# Patient Record
Sex: Female | Born: 1985 | Race: White | Hispanic: No | Marital: Single | Smoking: Former smoker
Health system: Southern US, Community
[De-identification: ages and names within clinical notes are randomized; demographics above are authoritative.]

## PROBLEM LIST (undated history)

## (undated) ENCOUNTER — Inpatient Hospital Stay (HOSPITAL_COMMUNITY): Payer: Self-pay

## (undated) DIAGNOSIS — F1910 Other psychoactive substance abuse, uncomplicated: Secondary | ICD-10-CM

## (undated) DIAGNOSIS — D649 Anemia, unspecified: Secondary | ICD-10-CM

## (undated) DIAGNOSIS — F17200 Nicotine dependence, unspecified, uncomplicated: Secondary | ICD-10-CM

## (undated) DIAGNOSIS — N83209 Unspecified ovarian cyst, unspecified side: Secondary | ICD-10-CM

## (undated) DIAGNOSIS — F1411 Cocaine abuse, in remission: Secondary | ICD-10-CM

## (undated) DIAGNOSIS — G894 Chronic pain syndrome: Secondary | ICD-10-CM

## (undated) HISTORY — DX: Anemia, unspecified: D64.9

## (undated) HISTORY — PX: EYE SURGERY: SHX253

## (undated) HISTORY — DX: Unspecified ovarian cyst, unspecified side: N83.209

## (undated) HISTORY — PX: OOPHORECTOMY: SHX86

## (undated) HISTORY — PX: TONSILLECTOMY: SUR1361

---
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
## 2008-07-07 ENCOUNTER — Ambulatory Visit (HOSPITAL_COMMUNITY): Admission: RE | Admit: 2008-07-07 | Discharge: 2008-07-07 | Payer: Self-pay | Admitting: Obstetrics and Gynecology

## 2008-08-09 ENCOUNTER — Ambulatory Visit: Admission: RE | Admit: 2008-08-09 | Discharge: 2008-08-09 | Payer: Self-pay | Admitting: Gynecologic Oncology

## 2008-08-16 ENCOUNTER — Ambulatory Visit (HOSPITAL_COMMUNITY): Admission: RE | Admit: 2008-08-16 | Discharge: 2008-08-16 | Payer: Self-pay | Admitting: Gynecologic Oncology

## 2008-08-16 ENCOUNTER — Encounter: Payer: Self-pay | Admitting: Gynecologic Oncology

## 2008-10-26 ENCOUNTER — Other Ambulatory Visit: Admission: RE | Admit: 2008-10-26 | Discharge: 2008-10-26 | Payer: Self-pay | Admitting: Obstetrics and Gynecology

## 2008-11-25 ENCOUNTER — Ambulatory Visit: Payer: Self-pay | Admitting: Advanced Practice Midwife

## 2008-11-25 ENCOUNTER — Inpatient Hospital Stay (HOSPITAL_COMMUNITY): Admission: AD | Admit: 2008-11-25 | Discharge: 2008-11-25 | Payer: Self-pay | Admitting: Obstetrics & Gynecology

## 2009-02-06 ENCOUNTER — Ambulatory Visit: Payer: Self-pay | Admitting: Advanced Practice Midwife

## 2009-02-06 ENCOUNTER — Other Ambulatory Visit: Payer: Self-pay | Admitting: Emergency Medicine

## 2009-02-06 ENCOUNTER — Inpatient Hospital Stay (HOSPITAL_COMMUNITY): Admission: AD | Admit: 2009-02-06 | Discharge: 2009-02-07 | Payer: Self-pay | Admitting: Obstetrics & Gynecology

## 2009-02-11 ENCOUNTER — Ambulatory Visit: Payer: Self-pay | Admitting: Obstetrics and Gynecology

## 2009-02-11 ENCOUNTER — Inpatient Hospital Stay (HOSPITAL_COMMUNITY): Admission: AD | Admit: 2009-02-11 | Discharge: 2009-02-15 | Payer: Self-pay | Admitting: Obstetrics and Gynecology

## 2010-10-28 LAB — CBC
HCT: 28.2 % — ABNORMAL LOW (ref 36.0–46.0)
HCT: 29.9 % — ABNORMAL LOW (ref 36.0–46.0)
Hemoglobin: 9.7 g/dL — ABNORMAL LOW (ref 12.0–15.0)
MCHC: 34.6 g/dL (ref 30.0–36.0)
MCHC: 35.5 g/dL (ref 30.0–36.0)
MCHC: 35.7 g/dL (ref 30.0–36.0)
MCV: 94 fL (ref 78.0–100.0)
MCV: 94.2 fL (ref 78.0–100.0)
Platelets: 157 10*3/uL (ref 150–400)
Platelets: 215 10*3/uL (ref 150–400)
RBC: 2.64 MIL/uL — ABNORMAL LOW (ref 3.87–5.11)
RBC: 2.99 MIL/uL — ABNORMAL LOW (ref 3.87–5.11)
RDW: 13.8 % (ref 11.5–15.5)
WBC: 13.8 10*3/uL — ABNORMAL HIGH (ref 4.0–10.5)
WBC: 9.6 10*3/uL (ref 4.0–10.5)

## 2010-10-28 LAB — DIC (DISSEMINATED INTRAVASCULAR COAGULATION)PANEL
INR: 1 (ref 0.00–1.49)
Platelets: 198 10*3/uL (ref 150–400)
Prothrombin Time: 12.9 seconds (ref 11.6–15.2)
Smear Review: NONE SEEN
aPTT: 30 seconds (ref 24–37)

## 2010-10-28 LAB — POCT I-STAT, CHEM 8
BUN: 5 mg/dL — ABNORMAL LOW (ref 6–23)
Chloride: 106 mEq/L (ref 96–112)
Creatinine, Ser: 0.6 mg/dL (ref 0.4–1.2)
Potassium: 3.7 mEq/L (ref 3.5–5.1)
Sodium: 137 mEq/L (ref 135–145)

## 2010-10-30 LAB — COMPREHENSIVE METABOLIC PANEL
ALT: 14 U/L (ref 0–35)
AST: 15 U/L (ref 0–37)
Albumin: 2.8 g/dL — ABNORMAL LOW (ref 3.5–5.2)
Alkaline Phosphatase: 48 U/L (ref 39–117)
BUN: 4 mg/dL — ABNORMAL LOW (ref 6–23)
Chloride: 109 mEq/L (ref 96–112)
Potassium: 3.7 mEq/L (ref 3.5–5.1)
Sodium: 137 mEq/L (ref 135–145)
Total Bilirubin: 0.5 mg/dL (ref 0.3–1.2)
Total Protein: 5.2 g/dL — ABNORMAL LOW (ref 6.0–8.3)

## 2010-10-30 LAB — CBC
HCT: 25.1 % — ABNORMAL LOW (ref 36.0–46.0)
Platelets: 183 10*3/uL (ref 150–400)
RDW: 13.6 % (ref 11.5–15.5)
WBC: 10.2 10*3/uL (ref 4.0–10.5)

## 2010-10-30 LAB — AMYLASE: Amylase: 39 U/L (ref 27–131)

## 2010-11-05 LAB — DIFFERENTIAL
Basophils Absolute: 0 10*3/uL (ref 0.0–0.1)
Lymphocytes Relative: 24 % (ref 12–46)
Lymphs Abs: 1.7 10*3/uL (ref 0.7–4.0)
Monocytes Absolute: 0.4 10*3/uL (ref 0.1–1.0)
Neutro Abs: 4.9 10*3/uL (ref 1.7–7.7)

## 2010-11-05 LAB — CBC
HCT: 31.1 % — ABNORMAL LOW (ref 36.0–46.0)
MCHC: 33.9 g/dL (ref 30.0–36.0)
MCV: 92.6 fL (ref 78.0–100.0)
Platelets: 174 10*3/uL (ref 150–400)
RDW: 13.2 % (ref 11.5–15.5)

## 2010-11-05 LAB — COMPREHENSIVE METABOLIC PANEL
AST: 17 U/L (ref 0–37)
Albumin: 3.3 g/dL — ABNORMAL LOW (ref 3.5–5.2)
BUN: 4 mg/dL — ABNORMAL LOW (ref 6–23)
Calcium: 8.8 mg/dL (ref 8.4–10.5)
Chloride: 107 mEq/L (ref 96–112)
Creatinine, Ser: 0.45 mg/dL (ref 0.4–1.2)
GFR calc Af Amer: >60 mL/min (ref >60)
Total Bilirubin: 0.4 mg/dL (ref 0.3–1.2)
Total Protein: 5.5 g/dL — ABNORMAL LOW (ref 6.0–8.3)

## 2010-12-04 NOTE — Consult Note (Signed)
[name], [name] NO.:  [id number]   MEDICAL RECORD NO.:  [id number]          PATIENT TYPE:  OUT   LOCATION:  GYN                          FACILITY:  [facility]   PHYSICIAN:  Laurette Schimke, MD     DATE OF BIRTH:  1985-09-29   DATE OF CONSULTATION:  08/09/2008  DATE OF DISCHARGE:                                 CONSULTATION   REASON FOR VISIT:  This patient was sent for consultation by Dr.  Emelda Fear for management of right adnexal mass.   HISTORY OF PRESENT ILLNESS:  This is a 25 year old, gravida 2, para 0  with an intra-uterine pregnancy at 13-4/7 weeks and estimated date of  confinement February 08, 2009.  On imaging, related to her pregnancy an  adnexal mass was noted.  Specifically, on July 07, 2008, a large  right adnexal structure was noted measuring 11.3 x 6.3 x 10.1 cm.  Of  note, there was an intracystic area of mural nodularity within the  cystic lesion measuring approximately 4.6 x 3.0.  It does not contain  identified blood flow on color Doppler.  The area of nodularity contains  several small cystic foci and areas of wall thickening and thickening of  septations.  Patient's history is notable for the fact that she  underwent drainage of a right adnexal cyst approximately 4 years ago.  She denies any weight loss, changes in appetite.  She does report  constipation.  She states that there was spotting last Thursday and she  has noted intermittent cramping this week.   PAST MEDICAL HISTORY:  Denies.   PAST SURGICAL HISTORY:  Laparoscopic drainage of right ovarian cyst in  2006.   PAST UTERINE HISTORY:  Gravida 2, para 0 with 1 medical abortion.   FAMILY HISTORY:  Noted for paternal aunt with breast cancer.  Age at  diagnosis is unknown.   SOCIAL HISTORY:  Tobacco history of 1/2 a pack per day for 5 years.  Stopped smoking 3 months ago at the time of diagnosis of pregnancy.  Alcohol use:  Denies.  Intravenous drug abuse:  Denies.  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   SCREENING HISTORY:  Last Pap March 2009.   REVIEW OF SYSTEMS:  As noted above.   PHYSICAL EXAMINATION:  Well-developed, thin female in no acute distress.  CHEST:  Clear to auscultation.  HEART:  Regular rate and rhythm.  VITAL SIGNS:  Within normal limits.  Weight 152 pounds.  Height 5 feet 8  inches.  ABDOMEN:  Soft, palpable uterus.  There is a mobile, but quite tender,  right adnexal mass.  The mass can be displaced to the upper abdomen.  PELVIC:  Normal external genitalia, Bartholin's, urethra, and Skene's.  No blood was in the vaginal vault.   IMPRESSION:  Tender mobile right lower quadrant mass.  I recommend  laparoscopic right salpingo-oophorectomy.  Patient is aware that the  risks of this procedure are preterm labor and pregnancy loss.  She is  also aware that if it is  not feasible to perform the procedure laparoscopically, likely  an open  procedure will be performed.  She is also aware that since she is  pregnant, surgical staging if there is a malignancy may be limited, just  do an oophorectomy.  The plan is to perform this procedure on August 16, 2008.      Laurette Schimke, MD  Electronically Signed     WB/MEDQ  D:  08/09/2008  T:  08/09/2008  Job:  [id number]   cc:   [name], M.D.  Fax: [phone]   Telford Nab, R.N.  [address]  [place], [place] [address]

## 2010-12-04 NOTE — Op Note (Signed)
[name], [name]              ACCOUNT NO.:  [id number]   MEDICAL RECORD NO.:  [id number]          PATIENT TYPE:  AMB   LOCATION:  DAY                          FACILITY:  [facility]   PHYSICIAN:  Paola A. Duard Brady, MD    DATE OF BIRTH:  22-Sep-1985   DATE OF PROCEDURE:  08/16/2008  DATE OF DISCHARGE:                               OPERATIVE REPORT   PREOPERATIVE DIAGNOSES:  1. A 42 week intrauterine pregnancy.  2. An 11-12 cm complex right ovarian mass.  3. Preoperative fetal heart tones 156.   POSTOPERATIVE DIAGNOSES:  1. A mucinous cystadenoma.  2. Fetal heart tones of 132.   PROCEDURE:  Laparoscopic right salpingo-oophorectomy.   SURGEON:  Dr. Duard Brady and Dr. Emelda Fear.   ANESTHESIA:  General.   ANESTHESIOLOGIST:  Dr. Rica Mast.   ESTIMATED BLOOD LOSS:  Minimal.   IV FLUIDS:  Was 2200 mL.   URINE OUTPUT:  Was 100 mL.   COMPLICATIONS:  None.   SPECIMENS:  Was to pathology.  Specimens included right tube and ovary  and abdominal pelvic washings.   OPERATIVE FINDINGS:  Included preoperative fetal heart tones of 156  document with Dop tones.  She had an 11-12 cm right ovarian mass.  Fluid  was clear yellow.  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  Postoperative fetal heart tones 132.   DESCRIPTION OF PROCEDURE:  The patient was taken to the operating room,  placed in supine position where general anesthesia was induced.  She was  then placed in dorsal lithotomy position with all appropriate  precautions being taken.  Her arms were tucked to her sides with all  appropriate precautions.  The shoulder blocks in the appropriate fashion  were placed.  The abdomen was then prepped in usual fashion.  A Foley  catheter was inserted in the bladder under sterile conditions.  The  patient was then draped in the usual fashion.  An OG tube was placed.  Time-out was performed to confirm the patient procedure, antibiotic  status, side of surgery and surgeons.   After ensuring that the OG tube  was in the appropriate place, a 5 mm incision was made two  fingerbreadths below the costal margin in the midclavicular line.  Using  the Opti Vu a 5 mm trocar was placed into the abdominal wall.  The  abdomen was insufflated with CO2 gas.  At this point in the procedure,  the patient's peak as intra-abdominal pressure did not increase over 15  mmHg.  Under direct visualization, we then placed a right-sided 5 mm  port, approximately 5 cm superior to the most superior aspect of the  mass.  A 10/12 port was then placed on the patient's left side lateral  and approximately 2-3 cm superior to the ACIS.  Abdominal pelvic  washings were then obtained.  We then came across the utero-ovarian with  Kleppingers.  The uterovarian ligament was then transected.  Our initial  attempt had been to come across the meso-ovarian and spared the  fallopian tube.  However, because of the size of the mass and the close  proximity to the fallopian tube, decision was made that the fallopian  tube could not be spared.  We came across the utero-ovarian pedicle and  the fallopian tube with Kleppinger.  There was a small amount of  bleeding that was controlled with a Kleppinger.  We then coagulated the  area and transected with monopolar cautery.  The 15 mm port was then  placed in the area of  the 10/12 port.  The spleen bag was then placed  into the abdominal cavity.  The mass was placed in the spleen bag and  delivered through the inferior left sided port.  The mass was  decompressed within the bag with no spillage on the patient or in the  abdomen.  It was drained of approximately 400 mL of clear yellow fluid.  The mass was then delivered in a piecemeal fashion to the bag.  Again  there was no rupture the bag.  The mass was sent for frozen section and  was consistent with benign disease.  Pedicle was inspected under low  flow and there was noted to be adequate hemostasis.  The  abdominal and  pelvis were irrigated.  Then 200 mL of saline was left in the abdomen.  The ports were removed under direct visualization.  The 10-12/15 mm port  fascia was closed with a figure-of-eight suture of 0 Vicryl on a UR6.  The subcutaneous tissue was reapproximated with 4-0 Vicryl.  Steri-  Strips were placed as were Band-Aids.  Fetal heart tones were then  performed to confirmed fetal heart tones of 132.   The patient tolerated procedure well and was taken to the recovery in  stable condition.  All instrument, needle and Ray-Tec counts were  correct x2.      Paola A. Duard Brady, MD  Electronically Signed     PAG/MEDQ  D:  08/16/2008  T:  08/16/2008  Job:  [id number]   cc:   Telford Nab, R.N.  [address]  [place], [place] [address]   [name], M.D.  Fax: [phone]

## 2010-12-04 NOTE — Op Note (Signed)
Tara, [name] NO.:  [id number]   MEDICAL RECORD NO.:  [id number]          PATIENT TYPE:  INP   LOCATION:  9114                          FACILITY:  [facility]   PHYSICIAN:  [name], M.D. DATE OF BIRTH:  [birth year]   DATE OF PROCEDURE:  02/12/2009  DATE OF DISCHARGE:                               OPERATIVE REPORT   PREOPERATIVE DIAGNOSIS:  Pregnancy 40 weeks' gestation, failure to  progress.   POSTOPERATIVE DIAGNOSIS:  Pregnancy 40 weeks' gestation, failure to  progress.   PROCEDURE:  Primary low-transverse cervical cesarean section.   SURGEON:  [name], MD   ASSISTANT:  None.   ANESTHESIA:  Epidural.   COMPLICATIONS:  None.   FINDINGS:  [name], a 7 pound 5 ounce female infant with significant  molding in the vertex.  Clear amniotic fluid.  No evidence of visible  nuchal cord.   INDICATIONS:  A 25 year old female presenting early on February 11, 2009  with spontaneous rupture of membranes at 8:00 a.m., admitted, and placed  on Pitocin reaching 5 cm dilated by 7:00 p.m. with minimal progress  thereafter.  She developed repetitive variable decelerations with  Pitocin, which could not be corrected with amnioinfusion, intermittent  stoppage in restarting of Pitocin.  Intolerance of labor by the infant  required cesarean delivery.   DETAILS OF PROCEDURE:  The patient was taken to the operating room,  prepped and draped in standard fashion.  Time-out was conducted and  confirmed IV antibiotics Ancef 1 g administered.  Lower abdominal  transverse incision was performed in method of Pfannenstiel with easy  dissection down to the fascia, transverse opening of the fascia, midline  opening of the rectus muscles and peritoneal cavity, and bladder flap  development on the lower uterine segment.  Transverse uterine incision  was performed and fetal vertex rotated from its quite molded position  and to the incision and delivered with fundal pressure  without  difficulty.  Cord was clamped.  The infant placed in the care of the  pediatricians, see their notes for further details.  Cord blood samples  were obtained.  Amniotic fluid was clear without malodor.  Placenta  delivered intact.  [name] presentation, 3-vessel cord confirmed.  The  uterus was swabbed out.  No suspicion of membrane remnants noted.  The  uterus was closed with single layer running locking 0 Vicryl.  Hemostasis was quite good.  A 2-0 Vicryl closure of the bladder flap  followed.  The peritoneal cavity was inspected.  There was a small bit  of omental adhesions to the right adnexa, coming from prior surgery  during the pregnancy.  These were freed-up easily.  The anterior  peritoneum was closed with running 2-0 Vicryl.  The fascia closed with continuous running 0 Vicryl subcu.  Fatty tissue  reapproximated with 3 interrupted sutures of 2-0 Vicryl and subcu 3-0  Vicryl used to close the skin.  Steri-Strips were applied with Benzoin  adhesive and the patient went to recovery room in good condition.  EBL  500 mL.      [name], M.D.  Electronically Signed     JVF/MEDQ  D:  02/12/2009  T:  02/12/2009  Job:  [id number]   cc:   Donna Bernard, M.D.  Fax: [phone]

## 2010-12-04 NOTE — Discharge Summary (Signed)
[name], [name] NO.:  [id number]   MEDICAL RECORD NO.:  [id number]          PATIENT TYPE:  INP   LOCATION:  9114                          FACILITY:  [facility]   PHYSICIAN:  [name], M.D. DATE OF BIRTH:  11-15-1985   DATE OF ADMISSION:  02/11/2009  DATE OF DISCHARGE:  02/15/2009                               DISCHARGE SUMMARY   PRIMARY CARE [name]:  [name], MD at [facility] in  [place].   DISCHARGE DIAGNOSES:  1. Low-transverse cesarean section for nonreassuring fetal heart tones      and failure to progress.  2. Maternal drug screen positive for marijuana.   DISCHARGE MEDICATIONS:  1. Iron sulfate 325 b.i.d.  2. Ibuprofen 600 mg q.8 h. p.r.n. pain.  3. Percocet 5/325 1-2 tabs q.4-6 h. p.r.n. pain.  4. Colace 100 mg b.i.d. p.r.n. constipation.   CONSULTANTS:  Ob-Gyn was heavily involved in the care throughout the  course.   PROCEDURES:  Low-transverse cesarean section on February 12, 2009.  Dr.  Emelda [name] performing cesarean, please see op note for further details.   LABORATORY DATA:  H and H at discharge was 8.8/24.8.  Other labs were  within normal limits with the exception of maternal drug screen positive  for marijuana.   BRIEF HOSPITAL COURSE:  This is a 25 year old G2, P1-0-1-1 who had a low-  transverse cesarean section on February 12, 2009 for nonreassuring fetal  heart tones and failure to progress.  She delivered a girl who is  weighing 7 pounds 5 ounces with Apgars of 9 and 9 at one and five  minutes, essentially no complications during the operation and no  complications with further postop care.  Incision was closed with  absorbable sutures.  The mom, [name] did test positive for marijuana  as noted above.  Social Work was involved and [name] will leave with  her baby and be followed up Social Work in [place].  She is scheduled  to have drug testing periodically.   DISCHARGE INSTRUCTIONS:  Routine for  cesarean section.   FOLLOWUP:  Followup appointments in 6 weeks at [facility].   DISCHARGE CONDITION:  This patient was discharged in stable medical  condition to home.      Clementeen Graham, MD      [name], M.D.  Electronically Signed    EC/MEDQ  D:  02/15/2009  T:  02/15/2009  Job:  [id number]

## 2011-11-09 ENCOUNTER — Emergency Department (HOSPITAL_COMMUNITY)
Admission: EM | Admit: 2011-11-09 | Discharge: 2011-11-09 | Disposition: A | Discharge status: Home / Self Care | Payer: Self-pay | Attending: Emergency Medicine | Admitting: Emergency Medicine

## 2011-11-09 ENCOUNTER — Encounter (HOSPITAL_COMMUNITY): Payer: Self-pay | Admitting: Emergency Medicine

## 2011-11-09 DIAGNOSIS — Z5189 Encounter for other specified aftercare: Secondary | ICD-10-CM

## 2011-11-09 DIAGNOSIS — Z4802 Encounter for removal of sutures: Secondary | ICD-10-CM | POA: Insufficient documentation

## 2011-11-09 MED ORDER — HYDROCODONE-ACETAMINOPHEN 5-325 MG PO TABS
1.0000 | ORAL_TABLET | Freq: Once | ORAL | Status: AC
  Administered 2011-11-09: 1 via ORAL
  Filled 2011-11-09: qty 1

## 2011-11-09 MED ORDER — HYDROCODONE-ACETAMINOPHEN 5-325 MG PO TABS: 1.0000 | ORAL_TABLET | ORAL | Status: AC | PRN

## 2011-11-09 NOTE — Discharge Instructions (Signed)
NOTHING FURTHER CAN BE DONE AT THIS TIME TO REMOVE ANY SUTURE MATERIAL THAT MAY OR MAY NOT BE LEFT IN THE SKIN. RETURN HERE WITH ANY WORSENING PAIN, RE-ACCUMULATION OF THE CYST, REDNESS OR FEVER. TAKE NORCO FOR PAIN AS NEEDED.

## 2011-11-09 NOTE — ED Notes (Addendum)
Per pt, had what she thought was abscess- had drained, but 4 days ago had to have it re-opened on 4/16 and was told that it is not an abscess, but a cyst that needs to be surgically removed- had site sutured up; d/t not having insurance, pt tried to d/c sutures herself, but when trying- tightened sutures; now she needs sutures removed; site is located on inner thigh

## 2011-11-09 NOTE — ED Provider Notes (Signed)
Medical screening examination/treatment/procedure(s) were performed by non-physician practitioner and as supervising physician I was immediately available for consultation/collaboration.  Nilo Fallin, MD 11/09/11 0341 

## 2011-11-09 NOTE — ED Provider Notes (Signed)
History     CSN: [id number]  Arrival date & time 11/09/11  0010   First MD Initiated Contact with Patient 11/09/11 0112      Chief Complaint  Patient presents with  . Suture / Staple Removal    (Consider location/radiation/quality/duration/timing/severity/associated sxs/prior treatment) Patient is a 26 y.o. female presenting with suture removal. The history is provided by the patient.  Suture / Staple Removal  The sutures were placed 3 to 6 days ago. Treatments tried: She had an I&D of a labial cyst in [place] 4 days ago that left 2 stitches. She attempted to remove them herself and reports that she feels that she did not remove the whole suture. There has been no drainage from the wound. There is no redness present. The pain has improved.    History reviewed. No pertinent past medical history.  Past Surgical History  Procedure Date  . Tonsillectomy   . Cesarean section   . Oophorectomy     History reviewed. No pertinent family history.  History  Substance Use Topics  . Smoking status: Current Everyday Smoker -- 0.5 packs/day  . Smokeless tobacco: Not on file  . Alcohol Use: No    OB History    Grav Para Term Preterm Abortions TAB SAB Ect Mult Living                  Review of Systems  Constitutional: Negative.  Negative for fever.  Skin:       See HPI.    Allergies  Review of patient's allergies indicates no known allergies.  Home Medications   Current Outpatient Rx  Name Route Sig Dispense Refill  . CEPHALEXIN 500 MG PO CAPS Oral Take 500 mg by mouth 3 (three) times daily.    Marland Kitchen NAPHAZOLINE HCL 0.012 % OP SOLN Both Eyes Place 1 drop into both eyes 4 (four) times daily as needed. For allergies    . OXYCODONE-ACETAMINOPHEN 5-325 MG PO TABS Oral Take 1 tablet by mouth every 4 (four) hours as needed. For pain    . SULFAMETHOXAZOLE-TRIMETHOPRIM 800-160 MG PO TABS Oral Take 1 tablet by mouth 2 (two) times daily.      BP 105/66  Pulse 68  Temp(Src) 97.8  F (36.6 C) (Oral)  Resp 18  SpO2 96%  LMP 10/28/2011  Physical Exam  Genitourinary:       1 cm linear incision right labia that is healing. No wound dehiscence. No visible sutures at site. No swelling.     ED Course  Procedures (including critical care time)  Labs Reviewed - No data to display No results found.   No diagnosis found.  1. Wound recheck -   MDM  There are no visible sutures at wound site. No redness. No further treatment. She is encouraged to return with any increasing pain, redness or drainage.         Rodena Medin, PA-C 11/09/11 0200

## 2012-01-07 ENCOUNTER — Emergency Department (HOSPITAL_COMMUNITY): Payer: Self-pay

## 2012-01-07 ENCOUNTER — Encounter (HOSPITAL_COMMUNITY): Payer: Self-pay | Admitting: Emergency Medicine

## 2012-01-07 ENCOUNTER — Emergency Department (HOSPITAL_COMMUNITY)
Admission: EM | Admit: 2012-01-07 | Discharge: 2012-01-08 | Disposition: A | Discharge status: Home / Self Care | Payer: Self-pay | Attending: Emergency Medicine | Admitting: Emergency Medicine

## 2012-01-07 DIAGNOSIS — S0280XA Fracture of other specified skull and facial bones, unspecified side, initial encounter for closed fracture: Secondary | ICD-10-CM | POA: Insufficient documentation

## 2012-01-07 DIAGNOSIS — S02839A Fracture of medial orbital wall, unspecified side, initial encounter for closed fracture: Secondary | ICD-10-CM

## 2012-01-07 DIAGNOSIS — S139XXA Sprain of joints and ligaments of unspecified parts of neck, initial encounter: Secondary | ICD-10-CM | POA: Insufficient documentation

## 2012-01-07 DIAGNOSIS — F17200 Nicotine dependence, unspecified, uncomplicated: Secondary | ICD-10-CM | POA: Insufficient documentation

## 2012-01-07 DIAGNOSIS — IMO0002 Reserved for concepts with insufficient information to code with codable children: Secondary | ICD-10-CM | POA: Insufficient documentation

## 2012-01-07 DIAGNOSIS — S161XXA Strain of muscle, fascia and tendon at neck level, initial encounter: Secondary | ICD-10-CM

## 2012-01-07 DIAGNOSIS — H5710 Ocular pain, unspecified eye: Secondary | ICD-10-CM | POA: Insufficient documentation

## 2012-01-07 DIAGNOSIS — S20229A Contusion of unspecified back wall of thorax, initial encounter: Secondary | ICD-10-CM | POA: Insufficient documentation

## 2012-01-07 DIAGNOSIS — Y92009 Unspecified place in unspecified non-institutional (private) residence as the place of occurrence of the external cause: Secondary | ICD-10-CM | POA: Insufficient documentation

## 2012-01-07 MED ORDER — TRAMADOL HCL 50 MG PO TABS
50.0000 mg | ORAL_TABLET | Freq: Once | ORAL | Status: AC
  Administered 2012-01-07: 50 mg via ORAL
  Filled 2012-01-07: qty 1

## 2012-01-07 NOTE — ED Notes (Signed)
Patient states she has to wear contacts and has a contact in the right eye but no contact in the left eye. When performing the visual acuity patient stated "I need to get an eye exam and I just woke up and I'm not supposed to sleep with the contacts in and now my vision is blurry."
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
## 2012-01-07 NOTE — ED Notes (Signed)
Left eye swollen shut, requests fluid be drawn from eye

## 2012-01-08 MED ORDER — CEPHALEXIN 500 MG PO CAPS: 500.0000 mg | ORAL_CAPSULE | Freq: Four times a day (QID) | ORAL | Status: AC

## 2012-01-08 MED ORDER — OXYCODONE-ACETAMINOPHEN 5-325 MG PO TABS: 1.0000 | ORAL_TABLET | ORAL | Status: AC | PRN

## 2012-01-08 MED ORDER — CEPHALEXIN 500 MG PO CAPS
500.0000 mg | ORAL_CAPSULE | Freq: Once | ORAL | Status: AC
  Administered 2012-01-08: 500 mg via ORAL
  Filled 2012-01-08: qty 1

## 2012-01-14 NOTE — ED Provider Notes (Signed)
Medical screening examination/treatment/procedure(s) were performed by non-physician practitioner and as supervising physician I was immediately available for consultation/collaboration.  Shelda Jakes, MD 01/14/12 [phone]

## 2012-08-25 ENCOUNTER — Emergency Department (HOSPITAL_BASED_OUTPATIENT_CLINIC_OR_DEPARTMENT_OTHER): Payer: Medicaid Other

## 2012-08-25 ENCOUNTER — Emergency Department (HOSPITAL_BASED_OUTPATIENT_CLINIC_OR_DEPARTMENT_OTHER)
Admission: EM | Admit: 2012-08-25 | Discharge: 2012-08-25 | Disposition: A | Discharge status: Home / Self Care | Payer: Medicaid Other | Attending: Emergency Medicine | Admitting: Emergency Medicine

## 2012-08-25 ENCOUNTER — Encounter (HOSPITAL_BASED_OUTPATIENT_CLINIC_OR_DEPARTMENT_OTHER): Payer: Self-pay | Admitting: *Deleted

## 2012-08-25 DIAGNOSIS — S0003XA Contusion of scalp, initial encounter: Secondary | ICD-10-CM | POA: Insufficient documentation

## 2012-08-25 DIAGNOSIS — F10929 Alcohol use, unspecified with intoxication, unspecified: Secondary | ICD-10-CM

## 2012-08-25 DIAGNOSIS — Z792 Long term (current) use of antibiotics: Secondary | ICD-10-CM | POA: Insufficient documentation

## 2012-08-25 DIAGNOSIS — F17200 Nicotine dependence, unspecified, uncomplicated: Secondary | ICD-10-CM | POA: Insufficient documentation

## 2012-08-25 DIAGNOSIS — F1010 Alcohol abuse, uncomplicated: Secondary | ICD-10-CM | POA: Insufficient documentation

## 2012-08-25 DIAGNOSIS — T07XXXA Unspecified multiple injuries, initial encounter: Secondary | ICD-10-CM

## 2012-08-25 DIAGNOSIS — Z79899 Other long term (current) drug therapy: Secondary | ICD-10-CM | POA: Insufficient documentation

## 2012-08-25 DIAGNOSIS — Z3202 Encounter for pregnancy test, result negative: Secondary | ICD-10-CM | POA: Insufficient documentation

## 2012-08-25 DIAGNOSIS — S0993XA Unspecified injury of face, initial encounter: Secondary | ICD-10-CM | POA: Insufficient documentation
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
## 2012-08-28 ENCOUNTER — Encounter (HOSPITAL_COMMUNITY): Payer: Self-pay | Admitting: *Deleted

## 2012-08-28 ENCOUNTER — Emergency Department (HOSPITAL_COMMUNITY): Payer: Medicaid Other

## 2012-08-28 ENCOUNTER — Emergency Department (HOSPITAL_COMMUNITY)
Admission: EM | Admit: 2012-08-28 | Discharge: 2012-08-29 | Disposition: A | Discharge status: Home / Self Care | Payer: Medicaid Other | Attending: Emergency Medicine | Admitting: Emergency Medicine

## 2012-08-28 DIAGNOSIS — F17200 Nicotine dependence, unspecified, uncomplicated: Secondary | ICD-10-CM | POA: Insufficient documentation

## 2012-08-28 DIAGNOSIS — R071 Chest pain on breathing: Secondary | ICD-10-CM | POA: Insufficient documentation

## 2012-08-28 DIAGNOSIS — Z79899 Other long term (current) drug therapy: Secondary | ICD-10-CM | POA: Insufficient documentation

## 2012-08-28 DIAGNOSIS — R6884 Jaw pain: Secondary | ICD-10-CM | POA: Insufficient documentation

## 2012-08-28 DIAGNOSIS — G8911 Acute pain due to trauma: Secondary | ICD-10-CM | POA: Insufficient documentation

## 2012-08-28 DIAGNOSIS — R0789 Other chest pain: Secondary | ICD-10-CM
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
## 2012-09-16 ENCOUNTER — Inpatient Hospital Stay (HOSPITAL_COMMUNITY): Payer: Medicaid Other

## 2012-09-16 ENCOUNTER — Inpatient Hospital Stay (HOSPITAL_COMMUNITY)
Admission: EM | Admit: 2012-09-16 | Discharge: 2012-09-23 | DRG: 781 | Disposition: A | Discharge status: Home Health | Payer: Medicaid Other | Attending: General Surgery | Admitting: General Surgery

## 2012-09-16 ENCOUNTER — Emergency Department (HOSPITAL_COMMUNITY): Payer: Medicaid Other

## 2012-09-16 ENCOUNTER — Encounter (HOSPITAL_COMMUNITY): Payer: Self-pay | Admitting: Emergency Medicine

## 2012-09-16 DIAGNOSIS — S01501A Unspecified open wound of lip, initial encounter: Secondary | ICD-10-CM | POA: Diagnosis present

## 2012-09-16 DIAGNOSIS — I609 Nontraumatic subarachnoid hemorrhage, unspecified: Secondary | ICD-10-CM | POA: Diagnosis present

## 2012-09-16 DIAGNOSIS — S0292XA Unspecified fracture of facial bones, initial encounter for closed fracture: Secondary | ICD-10-CM

## 2012-09-16 DIAGNOSIS — O99891 Other specified diseases and conditions complicating pregnancy: Principal | ICD-10-CM | POA: Diagnosis present

## 2012-09-16 DIAGNOSIS — S52501A Unspecified fracture of the lower end of right radius, initial encounter for closed fracture: Secondary | ICD-10-CM

## 2012-09-16 DIAGNOSIS — S82899A Other fracture of unspecified lower leg, initial encounter for closed fracture: Secondary | ICD-10-CM | POA: Diagnosis present

## 2012-09-16 DIAGNOSIS — S92109A Unspecified fracture of unspecified talus, initial encounter for closed fracture: Secondary | ICD-10-CM | POA: Diagnosis present

## 2012-09-16 DIAGNOSIS — S0240CA Maxillary fracture, right side, initial encounter for closed fracture: Secondary | ICD-10-CM

## 2012-09-16 DIAGNOSIS — K5900 Constipation, unspecified: Secondary | ICD-10-CM | POA: Diagnosis not present

## 2012-09-16 DIAGNOSIS — S52509A Unspecified fracture of the lower end of unspecified radius, initial encounter for closed fracture: Secondary | ICD-10-CM | POA: Diagnosis present

## 2012-09-16 DIAGNOSIS — S0230XA Fracture of orbital floor, unspecified side, initial encounter for closed fracture: Secondary | ICD-10-CM | POA: Diagnosis present

## 2012-09-16 DIAGNOSIS — S82891A Other fracture of right lower leg, initial encounter for closed fracture: Secondary | ICD-10-CM

## 2012-09-16 DIAGNOSIS — S066X9A Traumatic subarachnoid hemorrhage with loss of consciousness of unspecified duration, initial encounter: Secondary | ICD-10-CM

## 2012-09-16 DIAGNOSIS — F1410 Cocaine abuse, uncomplicated: Secondary | ICD-10-CM | POA: Diagnosis present

## 2012-09-16 DIAGNOSIS — F17200 Nicotine dependence, unspecified, uncomplicated: Secondary | ICD-10-CM | POA: Diagnosis present

## 2012-09-16 DIAGNOSIS — J939 Pneumothorax, unspecified: Secondary | ICD-10-CM

## 2012-09-16 DIAGNOSIS — S270XXA Traumatic pneumothorax, initial encounter: Secondary | ICD-10-CM | POA: Diagnosis present

## 2012-09-16 DIAGNOSIS — E876 Hypokalemia: Secondary | ICD-10-CM | POA: Diagnosis present

## 2012-09-16 DIAGNOSIS — Z3480 Encounter for supervision of other normal pregnancy, unspecified trimester: Secondary | ICD-10-CM

## 2012-09-16 DIAGNOSIS — S5291XA Unspecified fracture of right forearm, initial encounter for closed fracture: Secondary | ICD-10-CM

## 2012-09-16 DIAGNOSIS — S066XAA Traumatic subarachnoid hemorrhage with loss of consciousness status unknown, initial encounter: Secondary | ICD-10-CM | POA: Diagnosis present

## 2012-09-16 DIAGNOSIS — S0280XA Fracture of other specified skull and facial bones, unspecified side, initial encounter for closed fracture: Secondary | ICD-10-CM | POA: Diagnosis present

## 2012-09-16 DIAGNOSIS — S82201A Unspecified fracture of shaft of right tibia, initial encounter for closed fracture: Secondary | ICD-10-CM

## 2012-09-16 DIAGNOSIS — D62 Acute posthemorrhagic anemia: Secondary | ICD-10-CM | POA: Diagnosis not present

## 2012-09-16 DIAGNOSIS — S0285XA Fracture of orbit, unspecified, initial encounter for closed fracture: Secondary | ICD-10-CM

## 2012-09-16 DIAGNOSIS — S5290XA Unspecified fracture of unspecified forearm, initial encounter for closed fracture: Secondary | ICD-10-CM

## 2012-09-16 DIAGNOSIS — S27322A Contusion of lung, bilateral, initial encounter: Secondary | ICD-10-CM

## 2012-09-16 DIAGNOSIS — S02401A Maxillary fracture, unspecified, initial encounter for closed fracture: Secondary | ICD-10-CM | POA: Diagnosis present

## 2012-09-16 DIAGNOSIS — S27329A Contusion of lung, unspecified, initial encounter: Secondary | ICD-10-CM | POA: Diagnosis present

## 2012-09-16 DIAGNOSIS — S82401A Unspecified fracture of shaft of right fibula, initial encounter for closed fracture: Secondary | ICD-10-CM

## 2012-09-16 HISTORY — DX: Nicotine dependence, unspecified, uncomplicated: F17.200

## 2012-09-16 LAB — URINALYSIS, ROUTINE W REFLEX MICROSCOPIC
Bilirubin Urine: NEGATIVE
Glucose, UA: NEGATIVE mg/dL
Specific Gravity, Urine: 1.011 (ref 1.005–1.030)
Urobilinogen, UA: 0.2 mg/dL (ref 0.0–1.0)
pH: 6.5 (ref 5.0–8.0)

## 2012-09-16 LAB — URINE MICROSCOPIC-ADD ON

## 2012-09-16 LAB — RAPID URINE DRUG SCREEN, HOSP PERFORMED
Barbiturates: NOT DETECTED
Benzodiazepines: NOT DETECTED
Cocaine: POSITIVE — AB
Opiates: POSITIVE — AB

## 2012-09-16 LAB — POCT I-STAT, CHEM 8
BUN: 5 mg/dL — ABNORMAL LOW (ref 6–23)
Chloride: 103 mEq/L (ref 96–112)
Creatinine, Ser: 0.6 mg/dL (ref 0.50–1.10)
Glucose, Bld: 107 mg/dL — ABNORMAL HIGH (ref 70–99)
Potassium: 3.1 mEq/L — ABNORMAL LOW (ref 3.5–5.1)

## 2012-09-16 LAB — CBC
HCT: 39.7 % (ref 36.0–46.0)
Hemoglobin: 14.2 g/dL (ref 12.0–15.0)
MCH: 32 pg (ref 26.0–34.0)
MCHC: 35.8 g/dL (ref 30.0–36.0)
MCV: 89.4 fL (ref 78.0–100.0)
RBC: 4.44 MIL/uL (ref 3.87–5.11)

## 2012-09-16 MED ORDER — MIDAZOLAM HCL 2 MG/2ML IJ SOLN: 5.0000 mg | Freq: Once | INTRAMUSCULAR | Status: DC

## 2012-09-16 MED ORDER — FENTANYL CITRATE 0.05 MG/ML IJ SOLN
INTRAMUSCULAR | Status: AC
  Administered 2012-09-16: 100 ug
  Filled 2012-09-16: qty 2

## 2012-09-16 MED ORDER — DEXTROSE IN LACTATED RINGERS 5 % IV SOLN
INTRAVENOUS | Status: DC
  Administered 2012-09-16 – 2012-09-18 (×3): via INTRAVENOUS

## 2012-09-16 MED ORDER — IOHEXOL 300 MG/ML  SOLN
80.0000 mL | Freq: Once | INTRAMUSCULAR | Status: AC | PRN
  Administered 2012-09-16: 80 mL via INTRAVENOUS

## 2012-09-16 MED ORDER — MIDAZOLAM HCL 2 MG/2ML IJ SOLN
INTRAMUSCULAR | Status: AC
  Administered 2012-09-16: 5 mg
  Filled 2012-09-16: qty 6

## 2012-09-16 MED ORDER — DIPHENHYDRAMINE HCL 50 MG/ML IJ SOLN: 12.5000 mg | Freq: Four times a day (QID) | INTRAMUSCULAR | Status: DC | PRN

## 2012-09-16 MED ORDER — NALOXONE HCL 0.4 MG/ML IJ SOLN: 0.4000 mg | INTRAMUSCULAR | Status: DC | PRN

## 2012-09-16 MED ORDER — ONDANSETRON HCL 4 MG/2ML IJ SOLN: 4.0000 mg | Freq: Four times a day (QID) | INTRAMUSCULAR | Status: DC | PRN

## 2012-09-16 MED ORDER — MORPHINE SULFATE 4 MG/ML IJ SOLN
4.0000 mg | Freq: Once | INTRAMUSCULAR | Status: AC
  Administered 2012-09-16: 4 mg via INTRAVENOUS
  Filled 2012-09-16: qty 1

## 2012-09-16 MED ORDER — DIPHENHYDRAMINE HCL 12.5 MG/5ML PO ELIX
12.5000 mg | ORAL_SOLUTION | Freq: Four times a day (QID) | ORAL | Status: DC | PRN
  Filled 2012-09-16: qty 5

## 2012-09-16 MED ORDER — PANTOPRAZOLE SODIUM 40 MG PO TBEC: 40.0000 mg | DELAYED_RELEASE_TABLET | Freq: Every day | ORAL | Status: DC

## 2012-09-16 MED ORDER — HYDROMORPHONE 0.3 MG/ML IV SOLN
INTRAVENOUS | Status: AC
  Administered 2012-09-16: 0.3 mg via INTRAVENOUS
  Filled 2012-09-16: qty 25

## 2012-09-16 MED ORDER — PANTOPRAZOLE SODIUM 40 MG IV SOLR
40.0000 mg | Freq: Every day | INTRAVENOUS | Status: DC
  Filled 2012-09-16: qty 40

## 2012-09-16 MED ORDER — FENTANYL CITRATE 0.05 MG/ML IJ SOLN: 100.0000 ug | Freq: Once | INTRAMUSCULAR | Status: DC

## 2012-09-16 MED ORDER — SODIUM CHLORIDE 0.9 % IJ SOLN: 9.0000 mL | INTRAMUSCULAR | Status: DC | PRN

## 2012-09-16 MED ORDER — HYDROMORPHONE HCL PF 1 MG/ML IJ SOLN
1.0000 mg | INTRAMUSCULAR | Status: DC | PRN
  Administered 2012-09-16 (×3): 1 mg via INTRAVENOUS
  Filled 2012-09-16 (×3): qty 1

## 2012-09-16 MED ORDER — ONDANSETRON HCL 4 MG PO TABS: 4.0000 mg | ORAL_TABLET | Freq: Four times a day (QID) | ORAL | Status: DC | PRN

## 2012-09-16 MED ORDER — CLINDAMYCIN PHOSPHATE 600 MG/50ML IV SOLN
600.0000 mg | Freq: Once | INTRAVENOUS | Status: AC
  Administered 2012-09-16: 600 mg via INTRAVENOUS
  Filled 2012-09-16: qty 50

## 2012-09-16 MED ORDER — HYDROMORPHONE 0.3 MG/ML IV SOLN
INTRAVENOUS | Status: DC
  Administered 2012-09-17: 4.8 mg via INTRAVENOUS
  Administered 2012-09-17: 3 mg via INTRAVENOUS
  Administered 2012-09-17: 02:00:00 via INTRAVENOUS
  Filled 2012-09-16: qty 25

## 2012-09-16 MED ORDER — HYDROMORPHONE HCL PF 1 MG/ML IJ SOLN
INTRAMUSCULAR | Status: AC
  Administered 2012-09-16: 1 mg via INTRAVENOUS
  Filled 2012-09-16: qty 1

## 2012-09-16 NOTE — ED Notes (Signed)
PT mom Parke Poisson [phone] and Sharia Reeve [phone]
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
## 2012-09-16 NOTE — Progress Notes (Signed)
Pt. Transferred to ultrasound.  Order obtained to be without RN for trip via Dr. Magnus Ivan.  Pt. unable to urinate while in ultrasound.  Radiologist gave verbal order to obtain I&O cath.  I&O cath performed revealing urine in bladder.  Pt. stable at this time.

## 2012-09-16 NOTE — ED Notes (Signed)
Transported to ct scan, given pain meds, they are changing bed assignment to 3100
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
## 2012-09-16 NOTE — Consult Note (Signed)
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 Nieves, M.D.     ROS:   as above  Blood pressure 94/57, pulse 80, temperature 98.5 F (36.9 C), temperature source Oral, resp. rate 20, height 5\' 7"  (1.702 m), weight 74.8 kg (164 lb 14.5 oz), last menstrual period 08/19/2012, SpO2 99.00%.  PHYSICAL EXAM: Overall appearance:  Distressed.  Appropriate to questions.  Follows commands well.  Essentially no facial swelling or ecchymoses.  RIGHT forearm in splint.  RIGHT lower leg/ankle in splint.  Hard cervical collar in place.   Head:  No lacerations.  No Battle's sign.  Bony facial contours show step off RIGHT infra orb rim.  Nose intact.  Mandible intact.   Eyes:  Subconjunctival hemorrhage and chemosis, OD.  Subjectively intact vision OU.  Grossly conjugate gaze OU with no subjective diplopia.  Some non-specific pain with ROM OD.  PERRL. Ears: sl waxy.  No blood Nose:  Septum straight.  No blood, fx, lacerations. Oral Cavity:  Teeth in good repair. Oral Pharynx/Hypopharynx/Larynx:  Could not examine Neck:  Hard collar in place.  Studies Reviewed:  CT maxillofacial as above.    Assessment/Plan Complex, comminuted, and severely displaced RIGHT zygoma fx, incl med, lat, and inf walls of orbit, ant, lat, post walls of antrum.  Will need ice, elevation, no nose blowing.  Ophth  consult.  Anesthesia issues related to [facility], pneumotx, pregnancy.  This will require a surgical repair, including lateral brow, lower lid, and possibly sub labial approaches.  Orbital rim plating, orbital floor reconstruction plate.  Given minimal swelling, could do this any time after clearance from Ophth and Anesthesia.  Discussed with pt.  Will be glad to discuss with family as needed.  Flo Shanks 09/16/2012, 10:13 AM

## 2012-09-16 NOTE — Procedures (Signed)
Chest Tube Insertion Procedure Note  Indications:  Clinically significant left Pneumothorax  Pre-operative Diagnosis: Left Pneumothorax  Post-operative Diagnosis: Left Pneumothorax  Surgeon:  Violeta Gelinas, MD  Assist: Aris Georgia, PA-C  Procedure Details  Informed consent was obtained for the procedure, including sedation.  Risks of lung perforation, hemorrhage, arrhythmia, and adverse drug reaction were discussed.   After sterile skin prep, using standard technique, a 20 French tube was placed in the left Anterior axillary line at the nipple level. Large rush of air obtained. Tube was secured with sutures and occlusive dressing was applied. She tolerated this very well. Blood output less than 50 cc.  Estimated Blood Loss:  <50cc         Specimens:  None              Complications:  None; patient tolerated the procedure well.         Disposition: ICU - extubated and stable.         Condition: stable  Violeta Gelinas, MD, MPH, FACS Pager: [phone]
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
## 2012-09-16 NOTE — Progress Notes (Signed)
Orthopedic Tech Progress Note Patient Details:  [name] [birth year] [id number]  Ortho Devices Type of Ortho Device: Arm sling;Post (short arm) splint;Post (short leg) splint   Haskell Flirt 09/16/2012, 6:19 AM

## 2012-09-16 NOTE — Progress Notes (Signed)
Earmon Sherrow, MD, MPH, FACS Pager: [phone]  
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
## 2012-09-16 NOTE — Progress Notes (Signed)
Patient's family was called around 1600 per request of the patient.  The mother and father came to unit with a misunderstanding that the patient had been discharged from the E.D.  Patient was in ultrasound at this time. Her parents were lead down to ultrasound to talk with patient.  Patient does not want them to know that she is pregnant at this time.
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
## 2012-09-17 ENCOUNTER — Inpatient Hospital Stay (HOSPITAL_COMMUNITY): Payer: Medicaid Other

## 2012-09-17 ENCOUNTER — Inpatient Hospital Stay (HOSPITAL_COMMUNITY): Payer: Medicaid Other | Admitting: Anesthesiology

## 2012-09-17 ENCOUNTER — Encounter (HOSPITAL_COMMUNITY): Admission: EM | Disposition: A | Discharge status: Home Health | Payer: Self-pay | Source: Home / Self Care

## 2012-09-17 ENCOUNTER — Encounter (HOSPITAL_COMMUNITY): Payer: Self-pay | Admitting: Anesthesiology

## 2012-09-17 DIAGNOSIS — D62 Acute posthemorrhagic anemia: Secondary | ICD-10-CM | POA: Diagnosis not present

## 2012-09-17 DIAGNOSIS — F1910 Other psychoactive substance abuse, uncomplicated: Secondary | ICD-10-CM | POA: Insufficient documentation

## 2012-09-17 DIAGNOSIS — Z3480 Encounter for supervision of other normal pregnancy, unspecified trimester: Secondary | ICD-10-CM

## 2012-09-17 HISTORY — PX: ORIF ANKLE FRACTURE: SHX5408

## 2012-09-17 HISTORY — PX: OPEN REDUCTION INTERNAL FIXATION (ORIF) DISTAL RADIAL FRACTURE: SHX5989

## 2012-09-17 LAB — BASIC METABOLIC PANEL
BUN: <3 mg/dL — ABNORMAL LOW (ref 6–23)
CO2: 23 mEq/L (ref 19–32)
Chloride: 105 mEq/L (ref 96–112)
GFR calc Af Amer: >90 mL/min (ref >90)
Glucose, Bld: 109 mg/dL — ABNORMAL HIGH (ref 70–99)
Potassium: 3.6 mEq/L (ref 3.5–5.1)

## 2012-09-17 LAB — CBC
HCT: 34.1 % — ABNORMAL LOW (ref 36.0–46.0)
Hemoglobin: 11.8 g/dL — ABNORMAL LOW (ref 12.0–15.0)
RBC: 3.74 MIL/uL — ABNORMAL LOW (ref 3.87–5.11)
WBC: 7.5 10*3/uL (ref 4.0–10.5)

## 2012-09-17 SURGERY — OPEN REDUCTION INTERNAL FIXATION (ORIF) DISTAL RADIUS FRACTURE
Anesthesia: General | Site: Wrist | Laterality: Right | Wound class: Clean

## 2012-09-17 MED ORDER — HYDROMORPHONE 0.3 MG/ML IV SOLN
INTRAVENOUS | Status: DC
  Administered 2012-09-17 (×2): via INTRAVENOUS
  Administered 2012-09-17: 5.4 mg via INTRAVENOUS
  Administered 2012-09-18: 02:00:00 via INTRAVENOUS
  Administered 2012-09-18: 1.3 mg via INTRAVENOUS
  Filled 2012-09-17 (×3): qty 25

## 2012-09-17 MED ORDER — PRENATAL MULTIVITAMIN CH
1.0000 | ORAL_TABLET | Freq: Every day | ORAL | Status: DC
  Administered 2012-09-18 – 2012-09-23 (×5): 1 via ORAL
  Filled 2012-09-17 (×7): qty 1

## 2012-09-17 MED ORDER — ARTIFICIAL TEARS OP OINT
TOPICAL_OINTMENT | OPHTHALMIC | Status: DC | PRN
  Administered 2012-09-17: 1 via OPHTHALMIC

## 2012-09-17 MED ORDER — LACTATED RINGERS IV SOLN
INTRAVENOUS | Status: DC | PRN
  Administered 2012-09-17 (×3): via INTRAVENOUS

## 2012-09-17 MED ORDER — GLYCOPYRROLATE 0.2 MG/ML IJ SOLN
INTRAMUSCULAR | Status: DC | PRN
  Administered 2012-09-17: 0.4 mg via INTRAVENOUS

## 2012-09-17 MED ORDER — FENTANYL CITRATE 0.05 MG/ML IJ SOLN
INTRAMUSCULAR | Status: AC
  Filled 2012-09-17: qty 2

## 2012-09-17 MED ORDER — MEPERIDINE HCL 25 MG/ML IJ SOLN: 6.2500 mg | INTRAMUSCULAR | Status: DC | PRN

## 2012-09-17 MED ORDER — OXYCODONE HCL 5 MG PO TABS: 5.0000 mg | ORAL_TABLET | Freq: Once | ORAL | Status: DC | PRN

## 2012-09-17 MED ORDER — FENTANYL CITRATE 0.05 MG/ML IJ SOLN
INTRAMUSCULAR | Status: DC | PRN
  Administered 2012-09-17: 100 ug via INTRAVENOUS
  Administered 2012-09-17 (×2): 50 ug via INTRAVENOUS
  Administered 2012-09-17: 100 ug via INTRAVENOUS
  Administered 2012-09-17 (×4): 50 ug via INTRAVENOUS
  Administered 2012-09-17: 250 ug via INTRAVENOUS
  Administered 2012-09-17: 100 ug via INTRAVENOUS
  Administered 2012-09-17 (×2): 50 ug via INTRAVENOUS

## 2012-09-17 MED ORDER — HYDROMORPHONE HCL PF 1 MG/ML IJ SOLN
INTRAMUSCULAR | Status: AC
  Filled 2012-09-17: qty 1

## 2012-09-17 MED ORDER — FENTANYL CITRATE 0.05 MG/ML IJ SOLN
100.0000 ug | Freq: Once | INTRAMUSCULAR | Status: AC
  Administered 2012-09-17: 100 ug via INTRAVENOUS

## 2012-09-17 MED ORDER — CEFAZOLIN SODIUM 1-5 GM-% IV SOLN
1.0000 g | Freq: Once | INTRAVENOUS | Status: AC
  Administered 2012-09-17: 1 g via INTRAVENOUS
  Filled 2012-09-17 (×2): qty 50

## 2012-09-17 MED ORDER — OXYCODONE HCL 5 MG/5ML PO SOLN: 5.0000 mg | Freq: Once | ORAL | Status: DC | PRN

## 2012-09-17 MED ORDER — VECURONIUM BROMIDE 10 MG IV SOLR
INTRAVENOUS | Status: DC | PRN
  Administered 2012-09-17: 4 mg via INTRAVENOUS
  Administered 2012-09-17 (×2): 3 mg via INTRAVENOUS

## 2012-09-17 MED ORDER — LIDOCAINE HCL (CARDIAC) 20 MG/ML IV SOLN
INTRAVENOUS | Status: DC | PRN
  Administered 2012-09-17: 50 mg via INTRAVENOUS

## 2012-09-17 MED ORDER — HYDROMORPHONE HCL PF 1 MG/ML IJ SOLN
INTRAMUSCULAR | Status: DC | PRN
  Administered 2012-09-17 (×2): 0.5 mg via INTRAVENOUS

## 2012-09-17 MED ORDER — NEOSTIGMINE METHYLSULFATE 1 MG/ML IJ SOLN
INTRAMUSCULAR | Status: DC | PRN
  Administered 2012-09-17: 3 mg via INTRAVENOUS

## 2012-09-17 MED ORDER — EPHEDRINE SULFATE 50 MG/ML IJ SOLN
INTRAMUSCULAR | Status: DC | PRN
  Administered 2012-09-17: 5 mg via INTRAVENOUS

## 2012-09-17 MED ORDER — IPRATROPIUM-ALBUTEROL 20-100 MCG/ACT IN AERS
1.0000 | INHALATION_SPRAY | Freq: Four times a day (QID) | RESPIRATORY_TRACT | Status: DC | PRN
  Filled 2012-09-17: qty 4

## 2012-09-17 MED ORDER — OXYCODONE HCL 5 MG PO TABS
5.0000 mg | ORAL_TABLET | ORAL | Status: DC | PRN
Start: 2012-09-17 — End: 2012-09-18
  Administered 2012-09-18: 10 mg via ORAL
  Filled 2012-09-17: qty 2

## 2012-09-17 MED ORDER — PROPOFOL 10 MG/ML IV BOLUS
INTRAVENOUS | Status: DC | PRN
  Administered 2012-09-17: 200 mg via INTRAVENOUS

## 2012-09-17 MED ORDER — ACETAMINOPHEN 10 MG/ML IV SOLN
1000.0000 mg | Freq: Four times a day (QID) | INTRAVENOUS | Status: AC
  Administered 2012-09-18 (×4): 1000 mg via INTRAVENOUS
  Filled 2012-09-17 (×4): qty 100

## 2012-09-17 MED ORDER — HYDROMORPHONE HCL PF 1 MG/ML IJ SOLN
0.2500 mg | INTRAMUSCULAR | Status: DC | PRN
  Administered 2012-09-17 (×4): 0.5 mg via INTRAVENOUS

## 2012-09-17 MED ORDER — ONDANSETRON HCL 4 MG/2ML IJ SOLN
INTRAMUSCULAR | Status: DC | PRN
  Administered 2012-09-17: 4 mg via INTRAVENOUS

## 2012-09-17 MED ORDER — FENTANYL CITRATE 0.05 MG/ML IJ SOLN
INTRAMUSCULAR | Status: AC
  Administered 2012-09-17: 100 ug
  Filled 2012-09-17: qty 2

## 2012-09-17 MED ORDER — SUCCINYLCHOLINE CHLORIDE 20 MG/ML IJ SOLN
INTRAMUSCULAR | Status: DC | PRN
  Administered 2012-09-17: 100 mg via INTRAVENOUS

## 2012-09-17 MED ORDER — ONDANSETRON HCL 4 MG/2ML IJ SOLN: 4.0000 mg | Freq: Once | INTRAMUSCULAR | Status: DC | PRN

## 2012-09-17 SURGICAL SUPPLY — 111 items
BANDAGE ELASTIC 3 VELCRO ST LF (GAUZE/BANDAGES/DRESSINGS) ×3 IMPLANT
BANDAGE ELASTIC 4 VELCRO ST LF (GAUZE/BANDAGES/DRESSINGS) ×3 IMPLANT
BANDAGE ELASTIC 6 VELCRO ST LF (GAUZE/BANDAGES/DRESSINGS) ×3 IMPLANT
BANDAGE ESMARK 6X9 LF (GAUZE/BANDAGES/DRESSINGS) ×2 IMPLANT
BANDAGE GAUZE ELAST BULKY 4 IN (GAUZE/BANDAGES/DRESSINGS) ×6 IMPLANT
BIT DRILL 2 FAST STEP (BIT) ×3 IMPLANT
BIT DRILL 2.5X2.75 QC CALB (BIT) ×3 IMPLANT
BIT DRILL 2.5X4 QC (BIT) ×3 IMPLANT
BIT DRILL 3.5X5.5 QC CALB (BIT) ×3 IMPLANT
BIT DRILL CALIBRATED 2.7 (BIT) ×3 IMPLANT
BLADE AVERAGE 25X9 (BLADE) ×3 IMPLANT
BLADE SURG 10 STRL SS (BLADE) ×3 IMPLANT
BLADE SURG ROTATE 9660 (MISCELLANEOUS) ×3 IMPLANT
BNDG COHESIVE 4X5 TAN STRL (GAUZE/BANDAGES/DRESSINGS) ×3 IMPLANT
BNDG ESMARK 4X9 LF (GAUZE/BANDAGES/DRESSINGS) IMPLANT
BNDG ESMARK 6X9 LF (GAUZE/BANDAGES/DRESSINGS) ×3
BRUSH SCRUB DISP (MISCELLANEOUS) ×6 IMPLANT
CLOTH BEACON ORANGE TIMEOUT ST (SAFETY) ×3 IMPLANT
COVER SURGICAL LIGHT HANDLE (MISCELLANEOUS) ×3 IMPLANT
CUFF TOURNIQUET SINGLE 18IN (TOURNIQUET CUFF) ×3 IMPLANT
CUFF TOURNIQUET SINGLE 34IN LL (TOURNIQUET CUFF) ×3 IMPLANT
DECANTER SPIKE VIAL GLASS SM (MISCELLANEOUS) IMPLANT
DRAPE C-ARM 42X72 X-RAY (DRAPES) ×6 IMPLANT
DRAPE C-ARMOR (DRAPES) ×3 IMPLANT
DRAPE OEC MINIVIEW 54X84 (DRAPES) IMPLANT
DRAPE ORTHO SPLIT 77X108 STRL (DRAPES) ×3
DRAPE PROXIMA HALF (DRAPES) ×3 IMPLANT
DRAPE SURG 17X23 STRL (DRAPES) ×3 IMPLANT
DRAPE SURG ORHT 6 SPLT 77X108 (DRAPES) ×6 IMPLANT
DRAPE U-SHAPE 47X51 STRL (DRAPES) ×3 IMPLANT
DRSG ADAPTIC 3X8 NADH LF (GAUZE/BANDAGES/DRESSINGS) ×3 IMPLANT
DRSG EMULSION OIL 3X3 NADH (GAUZE/BANDAGES/DRESSINGS) ×3 IMPLANT
ELECT CAUTERY BLADE 6.4 (BLADE) ×3 IMPLANT
ELECT REM PT RETURN 9FT ADLT (ELECTROSURGICAL) ×3
ELECTRODE REM PT RTRN 9FT ADLT (ELECTROSURGICAL) ×2 IMPLANT
GLOVE BIO SURGEON STRL SZ7.5 (GLOVE) ×3 IMPLANT
GLOVE BIO SURGEON STRL SZ8 (GLOVE) ×3 IMPLANT
GLOVE BIOGEL PI IND STRL 7.0 (GLOVE) ×4 IMPLANT
GLOVE BIOGEL PI IND STRL 7.5 (GLOVE) ×4 IMPLANT
GLOVE BIOGEL PI IND STRL 8 (GLOVE) ×4 IMPLANT
GLOVE BIOGEL PI INDICATOR 7.0 (GLOVE) ×2
GLOVE BIOGEL PI INDICATOR 7.5 (GLOVE) ×2
GLOVE BIOGEL PI INDICATOR 8 (GLOVE) ×2
GLOVE SURG SS PI 7.5 STRL IVOR (GLOVE) ×3 IMPLANT
GLOVE SURG SS PI 8.0 STRL IVOR (GLOVE) ×3 IMPLANT
GOWN PREVENTION PLUS XLARGE (GOWN DISPOSABLE) ×3 IMPLANT
GOWN SRG XL XLNG 56XLVL 4 (GOWN DISPOSABLE) ×4 IMPLANT
GOWN STRL NON-REIN LRG LVL3 (GOWN DISPOSABLE) ×3 IMPLANT
GOWN STRL NON-REIN XL XLG LVL4 (GOWN DISPOSABLE) ×2
K-WIRE ACE 1.6X6 (WIRE) ×9
KIT BASIN OR (CUSTOM PROCEDURE TRAY) ×3 IMPLANT
KIT ROOM TURNOVER OR (KITS) ×3 IMPLANT
KWIRE 4.0 X .062IN (WIRE) ×3 IMPLANT
KWIRE ACE 1.6X6 (WIRE) ×6 IMPLANT
MANIFOLD NEPTUNE II (INSTRUMENTS) ×3 IMPLANT
MARKER SKIN DUAL TIP RULER LAB (MISCELLANEOUS) ×3 IMPLANT
NEEDLE HYPO 21X1.5 SAFETY (NEEDLE) IMPLANT
NEEDLE HYPO 25GX1X1/2 BEV (NEEDLE) IMPLANT
NS IRRIG 1000ML POUR BTL (IV SOLUTION) ×3 IMPLANT
PACK GENERAL/GYN (CUSTOM PROCEDURE TRAY) ×3 IMPLANT
PACK ORTHO EXTREMITY (CUSTOM PROCEDURE TRAY) ×3 IMPLANT
PAD ARMBOARD 7.5X6 YLW CONV (MISCELLANEOUS) ×6 IMPLANT
PAD CAST 3X4 CTTN HI CHSV (CAST SUPPLIES) ×2 IMPLANT
PAD CAST 4YDX4 CTTN HI CHSV (CAST SUPPLIES) ×2 IMPLANT
PADDING CAST ABS 4INX4YD NS (CAST SUPPLIES) ×1
PADDING CAST ABS 6INX4YD NS (CAST SUPPLIES) ×1
PADDING CAST ABS COTTON 4X4 ST (CAST SUPPLIES) ×2 IMPLANT
PADDING CAST ABS COTTON 6X4 NS (CAST SUPPLIES) ×2 IMPLANT
PADDING CAST COTTON 3X4 STRL (CAST SUPPLIES) ×1
PADDING CAST COTTON 4X4 STRL (CAST SUPPLIES) ×1
PADDING CAST COTTON 6X4 STRL (CAST SUPPLIES) IMPLANT
PEG SUBCHONDRAL SMOOTH 2.0X18 (Peg) ×6 IMPLANT
PEG SUBCHONDRAL SMOOTH 2.0X20 (Peg) ×6 IMPLANT
PEG SUBCHONDRAL SMOOTH 2.0X22 (Peg) ×3 IMPLANT
PEG THREADED 2.5MMX22MM LONG (Peg) ×6 IMPLANT
PENCIL BUTTON HOLSTER BLD 10FT (ELECTRODE) ×6 IMPLANT
PLATE LOCK 6H RT MED DIST TIB (Plate) ×3 IMPLANT
PLATE STAN 24.4X59.5 RT (Plate) ×3 IMPLANT
PUTTY GAMMA BSM 5CC (Putty) ×3 IMPLANT
SCREW BN 12X3.5XNS CORT TI (Screw) ×2 IMPLANT
SCREW CORT 3.5X10 LNG (Screw) ×3 IMPLANT
SCREW CORT 3.5X12 (Screw) ×1 IMPLANT
SCREW CORT 3.5X40 (Screw) ×3 IMPLANT
SCREW CORT FT 32X3.5XNONLOCK (Screw) ×2 IMPLANT
SCREW CORTICAL 3.5MM  32MM (Screw) ×1 IMPLANT
SCREW CORTICAL 3.5MM 24MM (Screw) ×3 IMPLANT
SCREW CORTICAL 3.5MM 36MM (Screw) ×3 IMPLANT
SCREW LOCK 3.5X34 DIST TIB (Screw) ×3 IMPLANT
SCREW LOCK 3.5X40 DIST TIB (Screw) ×6 IMPLANT
SCREW LOW PROFILE 3.5X48MM (Screw) ×3 IMPLANT
SCREW LP 3.5X44 (Screw) ×6 IMPLANT
SPONGE GAUZE 4X4 12PLY (GAUZE/BANDAGES/DRESSINGS) ×6 IMPLANT
SPONGE LAP 18X18 X RAY DECT (DISPOSABLE) IMPLANT
SPONGE SCRUB IODOPHOR (GAUZE/BANDAGES/DRESSINGS) ×6 IMPLANT
STAPLER VISISTAT 35W (STAPLE) IMPLANT
SUCTION FRAZIER TIP 10 FR DISP (SUCTIONS) ×3 IMPLANT
SUT ETHILON 2 0 FS 18 (SUTURE) IMPLANT
SUT ETHILON 3 0 PS 1 (SUTURE) ×9 IMPLANT
SUT PDS AB 2-0 CT1 27 (SUTURE) IMPLANT
SUT VIC AB 0 CT2 27 (SUTURE) ×9 IMPLANT
SUT VIC AB 2-0 CT1 27 (SUTURE) ×2
SUT VIC AB 2-0 CT1 TAPERPNT 27 (SUTURE) ×4 IMPLANT
SUT VIC AB 2-0 CT3 27 (SUTURE) ×3 IMPLANT
SUT VIC AB 2-0 SH 27 (SUTURE)
SUT VIC AB 2-0 SH 27XBRD (SUTURE) IMPLANT
SYR CONTROL 10ML LL (SYRINGE) IMPLANT
TOWEL OR 17X24 6PK STRL BLUE (TOWEL DISPOSABLE) ×6 IMPLANT
TOWEL OR 17X26 10 PK STRL BLUE (TOWEL DISPOSABLE) ×6 IMPLANT
TUBE CONNECTING 12X1/4 (SUCTIONS) ×3 IMPLANT
UNDERPAD 30X30 INCONTINENT (UNDERPADS AND DIAPERS) ×3 IMPLANT
WATER STERILE IRR 1000ML POUR (IV SOLUTION) IMPLANT

## 2012-09-17 NOTE — Anesthesia Preprocedure Evaluation (Signed)
Anesthesia Evaluation  Patient identified by MRN, date of birth, ID band Patient awake    Reviewed: Allergy & Precautions, H&P , NPO status , Patient's Chart, lab work & pertinent test results  Airway       Dental   Pulmonary  PTX with Chest Tube         Cardiovascular     Neuro/Psych    GI/Hepatic   Endo/Other    Renal/GU      Musculoskeletal   Abdominal   Peds  Hematology   Anesthesia Other Findings   Reproductive/Obstetrics                           Anesthesia Physical Anesthesia Plan  ASA: II  Anesthesia Plan: General   Post-op Pain Management:    Induction: Intravenous  Airway Management Planned: Oral ETT  Additional Equipment:   Intra-op Plan:   Post-operative Plan: Extubation in OR  Informed Consent: I have reviewed the patients History and Physical, chart, labs and discussed the procedure including the risks, benefits and alternatives for the proposed anesthesia with the patient or authorized representative who has indicated his/her understanding and acceptance.     Plan Discussed with: CRNA and Surgeon  Anesthesia Plan Comments: (Pt [redacted] weeks pregnant. Will do GA and avoid Versed)        Anesthesia Quick Evaluation
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
## 2012-09-17 NOTE — Anesthesia Postprocedure Evaluation (Signed)
  Anesthesia Post-op Note  Patient: [name]  Procedure(s) Performed: Procedure(s): OPEN REDUCTION INTERNAL FIXATION (ORIF) DISTAL RADIAL FRACTURE (Right) OPEN REDUCTION INTERNAL FIXATION (ORIF) ANKLE FRACTURE (Right)  Patient Location: PACU  Anesthesia Type:General  Level of Consciousness: sedated and patient cooperative, responds to voice  Airway and Oxygen Therapy: Patient Spontanous Breathing and Patient connected to nasal cannula oxygen  Post-op Pain: mild  Post-op Assessment: Post-op Vital signs reviewed, Patient's Cardiovascular Status Stable, Respiratory Function Stable, Patent Airway, No signs of Nausea or vomiting and Pain level controlled  Post-op Vital Signs: Reviewed and stable  Complications: No apparent anesthesia complications
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
## 2012-09-17 NOTE — Progress Notes (Signed)
Pt keeps taking ice off of arm

## 2012-09-17 NOTE — Progress Notes (Signed)
pt denies pain in right leg on arrival only pain in right wrist area, pt's nipple rings on chart from O R

## 2012-09-17 NOTE — Progress Notes (Signed)
Pt IV in pump at 75cc an hour gave report to nurse

## 2012-09-17 NOTE — Transfer of Care (Signed)
Immediate Anesthesia Transfer of Care Note  Patient: [name]  Procedure(s) Performed: Procedure(s): OPEN REDUCTION INTERNAL FIXATION (ORIF) DISTAL RADIAL FRACTURE (Right) OPEN REDUCTION INTERNAL FIXATION (ORIF) ANKLE FRACTURE (Right)  Patient Location: PACU  Anesthesia Type:General  Level of Consciousness: awake, alert  and oriented  Airway & Oxygen Therapy: Patient Spontanous Breathing and Patient connected to nasal cannula oxygen  Post-op Assessment: Report given to PACU RN, Post -op Vital signs reviewed and stable and Patient moving all extremities  Post vital signs: Reviewed and stable  Complications: No apparent anesthesia complications

## 2012-09-17 NOTE — Progress Notes (Signed)
Pt asleep easily arouses complaint of pain at right arch under right eye hurting , explained the pain med is for all her pain. Pt will be back on PCA pump in room. Pt attempting to put in nipple rings.

## 2012-09-18 ENCOUNTER — Inpatient Hospital Stay (HOSPITAL_COMMUNITY): Payer: Medicaid Other

## 2012-09-18 LAB — CBC
HCT: 33.8 % — ABNORMAL LOW (ref 36.0–46.0)
Hemoglobin: 11.9 g/dL — ABNORMAL LOW (ref 12.0–15.0)
MCH: 31.8 pg (ref 26.0–34.0)
MCHC: 35.2 g/dL (ref 30.0–36.0)

## 2012-09-18 MED ORDER — AMOXICILLIN 500 MG PO CAPS
500.0000 mg | ORAL_CAPSULE | Freq: Three times a day (TID) | ORAL | Status: DC
  Administered 2012-09-18 – 2012-09-22 (×13): 500 mg via ORAL
  Filled 2012-09-18 (×16): qty 1

## 2012-09-18 MED ORDER — DOCUSATE SODIUM 100 MG PO CAPS
100.0000 mg | ORAL_CAPSULE | Freq: Two times a day (BID) | ORAL | Status: DC
  Administered 2012-09-18 – 2012-09-23 (×10): 100 mg via ORAL
  Filled 2012-09-18 (×10): qty 1

## 2012-09-18 MED ORDER — ENOXAPARIN SODIUM 40 MG/0.4ML ~~LOC~~ SOLN
40.0000 mg | SUBCUTANEOUS | Status: DC
  Administered 2012-09-19 – 2012-09-23 (×4): 40 mg via SUBCUTANEOUS
  Filled 2012-09-18 (×5): qty 0.4

## 2012-09-18 MED ORDER — HYDROMORPHONE HCL PF 1 MG/ML IJ SOLN
1.0000 mg | INTRAMUSCULAR | Status: DC | PRN
  Administered 2012-09-18 – 2012-09-22 (×14): 1 mg via INTRAVENOUS
  Administered 2012-09-22: 0.5 mg via INTRAVENOUS
  Administered 2012-09-23: 1 mg via INTRAVENOUS
  Filled 2012-09-18 (×17): qty 1

## 2012-09-18 MED ORDER — POLYETHYLENE GLYCOL 3350 17 G PO PACK
17.0000 g | PACK | Freq: Every day | ORAL | Status: DC
  Administered 2012-09-18 – 2012-09-23 (×5): 17 g via ORAL
  Filled 2012-09-18 (×6): qty 1

## 2012-09-18 MED ORDER — OXYCODONE HCL 5 MG PO TABS
10.0000 mg | ORAL_TABLET | ORAL | Status: DC | PRN
  Administered 2012-09-18 (×2): 10 mg via ORAL
  Administered 2012-09-18 (×2): 20 mg via ORAL
  Administered 2012-09-18: 10 mg via ORAL
  Administered 2012-09-19 – 2012-09-20 (×9): 20 mg via ORAL
  Administered 2012-09-21: 10 mg via ORAL
  Administered 2012-09-21 (×2): 20 mg via ORAL
  Administered 2012-09-21: 10 mg via ORAL
  Administered 2012-09-21 (×2): 20 mg via ORAL
  Administered 2012-09-21: 10 mg via ORAL
  Administered 2012-09-22 – 2012-09-23 (×8): 20 mg via ORAL
  Filled 2012-09-18: qty 4
  Filled 2012-09-18: qty 2
  Filled 2012-09-18 (×2): qty 4
  Filled 2012-09-18: qty 2
  Filled 2012-09-18 (×9): qty 4
  Filled 2012-09-18 (×2): qty 2
  Filled 2012-09-18 (×4): qty 4
  Filled 2012-09-18: qty 2
  Filled 2012-09-18 (×7): qty 4
  Filled 2012-09-18: qty 2
  Filled 2012-09-18: qty 4
  Filled 2012-09-18: qty 2

## 2012-09-18 MED ORDER — TRAMADOL HCL 50 MG PO TABS
100.0000 mg | ORAL_TABLET | Freq: Four times a day (QID) | ORAL | Status: DC
  Administered 2012-09-18 – 2012-09-23 (×20): 100 mg via ORAL
  Filled 2012-09-18 (×30): qty 2
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
## 2012-09-18 NOTE — Progress Notes (Signed)
C/O no BM, will add colace/miralax Dr. Lazarus Salines palns ORIF facial Fxs Tuesday. Plan of care D/W patient. Patient examined and I agree with the assessment and plan  Violeta Gelinas, MD, MPH, FACS Pager: [phone]  09/18/2012 9:28 AM
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
## 2012-09-18 NOTE — Progress Notes (Signed)
Rehab Admissions Coordinator Note:  Patient was screened by Clois Dupes for appropriateness for an Inpatient Acute Rehab Consult. Up with therapy for the first time today. Noted ORIF with Dr. Lazarus Salines Tuesday. I would like to see how she mobilizes with therapy over the next few days with PT and OT before asking for an IP rehab consult or determining rehab venue options.  Clois Dupes 09/18/2012, 3:37 PM  I can be reached at [phone].

## 2012-09-19 ENCOUNTER — Inpatient Hospital Stay (HOSPITAL_COMMUNITY): Payer: Medicaid Other

## 2012-09-19 MED ORDER — ACETAMINOPHEN 325 MG PO TABS
650.0000 mg | ORAL_TABLET | Freq: Four times a day (QID) | ORAL | Status: DC | PRN
  Administered 2012-09-19: 650 mg via ORAL
  Filled 2012-09-19: qty 2
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
## 2012-09-19 NOTE — Progress Notes (Signed)
PT Cancellation Note  Patient Details Name: [name] MRN: [id number] DOB: [birth year]   Cancelled Treatment:     Pt requesting to hold therapy at this time due to just getting drain removed & pain.  Will attempt f/u later today if time allows.    Verdell Face, [place] [phone] 09/19/2012
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
## 2012-09-19 NOTE — Progress Notes (Signed)
Subjective: 2 Days Post-Op Procedure(s) (LRB): OPEN REDUCTION INTERNAL FIXATION (ORIF) DISTAL RADIAL FRACTURE (Right) OPEN REDUCTION INTERNAL FIXATION (ORIF) ANKLE FRACTURE (Right) Patient reports pain as moderate.  Received call from RN stating pt was c/o ankle splint being too tight causing burning pain.  Objective: Vital signs in last 24 hours: Temp:  [97.7 F (36.5 C)-98.8 F (37.1 C)] 97.7 F (36.5 C) (03/01 0612) Pulse Rate:  [58-65] 65 (03/01 0612) Resp:  [18] 18 (03/01 0612) BP: (95-115)/(61-71) 109/71 mmHg (03/01 0612) SpO2:  [99 %-100 %] 100 % (03/01 0612)  Intake/Output from previous day: 02/28 0701 - 03/01 0700 In: 940 [P.O.:840; IV Piggyback:100] Out: 1200 [Urine:1200] Intake/Output this shift:     Recent Labs  09/17/12 0810 09/18/12 0445  HGB 11.8* 11.9*    Recent Labs  09/17/12 0810 09/18/12 0445  WBC 7.5 11.0*  RBC 3.74* 3.74*  HCT 34.1* 33.8*  PLT 178 225    Recent Labs  09/17/12 0810  NA 137  K 3.6  CL 105  CO2 23  BUN <3*  CREATININE 0.47*  GLUCOSE 109*  CALCIUM 8.0*   No results found for this basename: LABPT, INR,  in the last 72 hours  Wiggles toes, no discoloration, intact capillary refill, SILT  Assessment/Plan: 2 Days Post-Op Procedure(s) (LRB): OPEN REDUCTION INTERNAL FIXATION (ORIF) DISTAL RADIAL FRACTURE (Right) OPEN REDUCTION INTERNAL FIXATION (ORIF) ANKLE FRACTURE (Right) Continue NWB RLE Splint was loosened some, pt states pain improved, did not appear too tight, does not appear to have much swelling.   Margart Sickles 09/19/2012, 9:33 AM

## 2012-09-20 ENCOUNTER — Inpatient Hospital Stay (HOSPITAL_COMMUNITY): Payer: Medicaid Other

## 2012-09-20 ENCOUNTER — Encounter (HOSPITAL_COMMUNITY): Payer: Self-pay | Admitting: Orthopedic Surgery

## 2012-09-20 LAB — GONOCOCCUS CULTURE: Culture: NO GROWTH

## 2012-09-20 MED ORDER — POLYETHYLENE GLYCOL 3350 17 G PO PACK
17.0000 g | PACK | Freq: Two times a day (BID) | ORAL | Status: DC
  Administered 2012-09-20 (×2): 17 g via ORAL
  Filled 2012-09-20 (×4): qty 1
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
## 2012-09-20 NOTE — Progress Notes (Signed)
Physical Therapy Treatment Patient Details Name: [name] MRN: [id number] DOB: 1985-08-22 Today's Date: 09/20/2012 Time: 4098-1191 PT Time Calculation (min): 24 min  PT Assessment / Plan / Recommendation Comments on Treatment Session  Pt making progress with activity today. Able to transfer from bed and toilet. Some difficulty with use of PRW and maintaining NWBing precautions.  Patient in a lot of pain. Limited activity. Will continue to see and progress as tolerated..    Follow Up Recommendations  CIR     Does the patient have the potential to tolerate intense rehabilitation     Barriers to Discharge        Equipment Recommendations  Other (comment) (Right PFRW)    Recommendations for Other Services Rehab consult  Frequency Min 6X/week   Plan      Precautions / Restrictions Restrictions Weight Bearing Restrictions: Yes RLE Weight Bearing: Non weight bearing Other Position/Activity Restrictions: NWB through right wrist; [name] verbally OK'd use of PFRW during this session when he checked in on pt.    Pertinent Vitals/Pain 8/10    Mobility  Bed Mobility Bed Mobility: Supine to Sit;Sitting - Scoot to Edge of Bed;Sit to Supine Supine to Sit: 5: Supervision Sitting - Scoot to Edge of Bed: 6: Modified independent (Device/Increase time) Details for Bed Mobility Assistance: pt. managed well with superivsion for safety Transfers Transfers: Sit to Stand;Stand to Sit;Stand Pivot Transfers Sit to Stand: 4: Min assist;With upper extremity assist;Other (comment);From bed (with left UE) Stand to Sit: 4: Min assist;With upper extremity assist;To chair/3-in-1;Other (comment) (left UE) Stand Pivot Transfers: 4: Min assist Details for Transfer Assistance: VC's for non wt bearing Ambulation/Gait Ambulation/Gait Assistance: 4: Min guard Ambulation Distance (Feet): 20 Feet Assistive device: Right platform [name] Ambulation/Gait Assistance Details: VC's for proper use of AD and wt  bearing Gait Pattern: Step-to pattern     PT Goals Acute Rehab PT Goals PT Goal Formulation: With patient Time For Goal Achievement: 09/25/12 Potential to Achieve Goals: Good Pt will go Supine/Side to Sit: Independently PT Goal: Supine/Side to Sit - Progress: Progressing toward goal Pt will go Sit to Supine/Side: Independently Pt will go Sit to Stand: with supervision PT Goal: Sit to Stand - Progress: Progressing toward goal Pt will go Stand to Sit: with supervision PT Goal: Stand to Sit - Progress: Progressing toward goal Pt will Transfer Bed to Chair/Chair to Bed: with supervision PT Transfer Goal: Bed to Chair/Chair to Bed - Progress: Progressing toward goal Pt will Go Up / Down Stairs: 3-5 stairs;with mod assist;with least restrictive assistive device  Visit Information  Last PT Received On: 09/20/12 Assistance Needed: +2 (2 helpful for use of PFRW next visit) PT/OT Co-Evaluation/Treatment: Yes    Subjective Data  Subjective: I just feel sore and my mouth hurts i can't eat Patient Stated Goal: pt. did not provide goal but agreeable to increasing activity   Cognition  Cognition Overall Cognitive Status: Impaired Area of Impairment: Attention;Other (comment) (distractable and easily overly stimulated; flat affect) Arousal/Alertness: Awake/alert Orientation Level: Oriented X4 / Intact Behavior During Session: WFL for tasks performed Current Attention Level: Sustained       End of Session PT - End of Session Equipment Utilized During Treatment: Gait belt Activity Tolerance: Patient limited by pain Patient left: in chair;with call bell/phone within reach Nurse Communication: Mobility status;Weight bearing status   GP     [name] 09/20/2012, 4:06 PM [name], PT DPT  [phone]

## 2012-09-21 DIAGNOSIS — I609 Nontraumatic subarachnoid hemorrhage, unspecified: Secondary | ICD-10-CM

## 2012-09-21 DIAGNOSIS — S069X9A Unspecified intracranial injury with loss of consciousness of unspecified duration, initial encounter: Secondary | ICD-10-CM

## 2012-09-21 DIAGNOSIS — D62 Acute posthemorrhagic anemia: Secondary | ICD-10-CM

## 2012-09-21 LAB — CBC
MCH: 31.8 pg (ref 26.0–34.0)
MCHC: 35.4 g/dL (ref 30.0–36.0)
MCV: 90 fL (ref 78.0–100.0)

## 2012-09-21 LAB — COMPREHENSIVE METABOLIC PANEL
ALT: 26 U/L (ref 0–35)
Alkaline Phosphatase: 63 U/L (ref 39–117)
Chloride: 98 mEq/L (ref 96–112)
GFR calc Af Amer: >90 mL/min (ref >90)
Glucose, Bld: 89 mg/dL (ref 70–99)
Potassium: 3.6 mEq/L (ref 3.5–5.1)
Sodium: 134 mEq/L — ABNORMAL LOW (ref 135–145)
Total Bilirubin: 0.4 mg/dL (ref 0.3–1.2)
Total Protein: 6.6 g/dL (ref 6.0–8.3)

## 2012-09-21 MED ORDER — FENTANYL CITRATE 0.05 MG/ML IJ SOLN: 50.0000 ug | Freq: Once | INTRAMUSCULAR | Status: DC

## 2012-09-21 MED ORDER — CHLORHEXIDINE GLUCONATE 4 % EX LIQD
1.0000 "application " | Freq: Once | CUTANEOUS | Status: AC
  Administered 2012-09-21: 1 via TOPICAL
  Filled 2012-09-21 (×2): qty 15

## 2012-09-21 MED ORDER — OXYCODONE HCL ER 10 MG PO T12A
10.0000 mg | EXTENDED_RELEASE_TABLET | Freq: Two times a day (BID) | ORAL | Status: DC
  Administered 2012-09-21 – 2012-09-22 (×3): 10 mg via ORAL
  Filled 2012-09-21 (×3): qty 1

## 2012-09-21 MED ORDER — MIDAZOLAM HCL 2 MG/2ML IJ SOLN: 1.0000 mg | INTRAMUSCULAR | Status: DC | PRN

## 2012-09-21 MED ORDER — CEFAZOLIN SODIUM-DEXTROSE 2-3 GM-% IV SOLR
2.0000 g | Freq: Once | INTRAVENOUS | Status: DC
  Filled 2012-09-21: qty 50

## 2012-09-21 MED ORDER — CHLORHEXIDINE GLUCONATE 4 % EX LIQD
1.0000 "application " | Freq: Once | CUTANEOUS | Status: AC
  Administered 2012-09-22: 1 via TOPICAL
  Filled 2012-09-21: qty 15

## 2012-09-21 NOTE — Clinical Social Work Note (Signed)
Clinical Social Worker met with patient at bedside to offer continued support.  Patient sitting up in the bed and very willing to engage in conversation with CSW.  Patient spoke at length with CSW regarding her new found pregnancy and concerns regarding financial commitments.  Patient is reaching for resources to assist with rent payment for her apartment until she is able to have some source of income.  CSW to look into resources on patient behalf.  Patient has a strained relationship with family and relies on friendships to assist as needed.  Patient father is currently receiving Chemotherapy for newly diagnosed cancer.    Patient shared that she started drinking alcohol about a year ago when her and her child's father split up after 6 years.  Patient states that she does not drink heavily often but during social times may drink heavily and smoke marijuana.  Patient would not admit to cocaine use at this time.  SBIRT complete.  Patient states that due to her new found pregnancy, "no more drugs of alcohol."  Patient comfortable with ceasing use without assistance at this time.  Patient boyfriend aware of patient pregnancy and is prepared to assist/be supportive as needed.  Patient is scheduled to complete Medicaid application on Wednesday here on site.  Patient hopeful for short inpatient rehab stay and then home to her apartment with assistance from friends and family.  CSW remains available for emotional support and to assist with discharge needs if appropriate.  Macario Golds, [place] [phone]

## 2012-09-21 NOTE — Progress Notes (Signed)
09/21/2012 11:40 AM  [name] [id number]  Hospital day 5    Temp:  [97.7 F (36.5 C)-98.4 F (36.9 C)] 98.4 F (36.9 C) (03/03 0533) Pulse Rate:  [67-74] 72 (03/03 0533) Resp:  [18-20] 18 (03/03 0533) BP: (110-120)/(63-72) 110/63 mmHg (03/03 0533) SpO2:  [97 %-99 %] 97 % (03/03 0533),     Intake/Output Summary (Last 24 hours) at 09/21/12 1140 Last data filed at 09/21/12 0400  Gross per 24 hour  Intake    720 ml  Output      0 ml  Net    720 ml    Results for orders placed during the hospital encounter of 09/16/12 (from the past 24 hour(s))  CBC     Status: None   Collection Time    09/21/12  6:50 AM      Result Value Range   WBC 6.9  4.0 - 10.5 K/uL   RBC 4.18  3.87 - 5.11 MIL/uL   Hemoglobin 13.3  12.0 - 15.0 g/dL   HCT 40.1  02.7 - 25.3 %   MCV 90.0  78.0 - 100.0 fL   MCH 31.8  26.0 - 34.0 pg   MCHC 35.4  30.0 - 36.0 g/dL   RDW 66.4  40.3 - 47.4 %   Platelets 251  150 - 400 K/uL  COMPREHENSIVE METABOLIC PANEL     Status: Abnormal   Collection Time    09/21/12  6:50 AM      Result Value Range   Sodium 134 (*) 135 - 145 mEq/L   Potassium 3.6  3.5 - 5.1 mEq/L   Chloride 98  96 - 112 mEq/L   CO2 26  19 - 32 mEq/L   Glucose, Bld 89  70 - 99 mg/dL   BUN 7  6 - 23 mg/dL   Creatinine, Ser 2.59  0.50 - 1.10 mg/dL   Calcium 9.1  8.4 - 56.3 mg/dL   Total Protein 6.6  6.0 - 8.3 g/dL   Albumin 3.1 (*) 3.5 - 5.2 g/dL   AST 22  0 - 37 U/L   ALT 26  0 - 35 U/L   Alkaline Phosphatase 63  39 - 117 U/L   Total Bilirubin 0.4  0.3 - 1.2 mg/dL   GFR calc non Af Amer >90  >90 mL/min   GFR calc Af Amer >90  >90 mL/min    SUBJECTIVE:  Slightly less pain OD region  OBJECTIVE:    Ecchymotic, sl swollen, OD.  IMPRESSION:  RIGHT trimalar zygomatic fx with multiple orbital fractures.  PLAN:  On schedule for ORIF tomorrow.  Discussed surgery with patient.  Risks and complications detailed.  Questions answered.  Informed consent obtained.  Pre-op orders written.     Flo Shanks
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
## 2012-09-21 NOTE — Progress Notes (Signed)
This patient has been seen and I agree with the findings and treatment plan.  James O. Wyatt, III, MD, FACS [phone] (pager) [phone] (direct pager) Trauma Surgeon  
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
## 2012-09-21 NOTE — Progress Notes (Signed)
Rehab admissions - Evaluated for possible admission.  I spoke with patient.  She says she is scheduled for surgery at 11 am tomorrow for facial reconstruction.  Surgery likely to take 2-3 hrs, so I would not anticipate inpatient rehab admission until Wednesday at the earliest.  Patient is wondering about discharging directly home and not coming to inpatient rehab once facial surgery is complete.  I told her that I will see her Wednesday morning to discuss options of inpatient rehab versus home with supervision.  Call me for questions.  #[phone]

## 2012-09-22 ENCOUNTER — Encounter (HOSPITAL_COMMUNITY): Payer: Self-pay | Admitting: Certified Registered"

## 2012-09-22 ENCOUNTER — Encounter (HOSPITAL_COMMUNITY): Payer: Self-pay | Admitting: Anesthesiology

## 2012-09-22 ENCOUNTER — Encounter (HOSPITAL_COMMUNITY): Admission: EM | Disposition: A | Discharge status: Home Health | Payer: Self-pay | Source: Home / Self Care

## 2012-09-22 ENCOUNTER — Inpatient Hospital Stay (HOSPITAL_COMMUNITY): Payer: Medicaid Other | Admitting: Anesthesiology

## 2012-09-22 ENCOUNTER — Inpatient Hospital Stay (HOSPITAL_COMMUNITY): Payer: Medicaid Other

## 2012-09-22 HISTORY — PX: ORIF ZYGOMATIC FRACTURE: SHX2134

## 2012-09-22 LAB — CBC
HCT: 35.9 % — ABNORMAL LOW (ref 36.0–46.0)
Hemoglobin: 12.8 g/dL (ref 12.0–15.0)
MCH: 31.8 pg (ref 26.0–34.0)
MCV: 89.1 fL (ref 78.0–100.0)
RBC: 4.03 MIL/uL (ref 3.87–5.11)
WBC: 11.3 10*3/uL — ABNORMAL HIGH (ref 4.0–10.5)

## 2012-09-22 LAB — CREATININE, SERUM: GFR calc Af Amer: >90 mL/min (ref >90)

## 2012-09-22 LAB — ANAEROBIC CULTURE

## 2012-09-22 SURGERY — OPEN REDUCTION INTERNAL FIXATION (ORIF) ZYGOMATIC FRACTURE
Anesthesia: General | Site: Face | Laterality: Right | Wound class: Clean

## 2012-09-22 MED ORDER — DEXTROSE 5 % IV SOLN
500.0000 mg | Freq: Three times a day (TID) | INTRAVENOUS | Status: DC
  Administered 2012-09-22 – 2012-09-23 (×2): 500 mg via INTRAVENOUS
  Filled 2012-09-22 (×6): qty 5

## 2012-09-22 MED ORDER — NEOMYCIN-POLYMYXIN-DEXAMETH 3.5-10000-0.1 OP OINT
TOPICAL_OINTMENT | Freq: Two times a day (BID) | OPHTHALMIC | Status: DC
  Administered 2012-09-22 – 2012-09-23 (×2): via OPHTHALMIC
  Filled 2012-09-22 (×2): qty 3.5

## 2012-09-22 MED ORDER — HYDROMORPHONE HCL PF 1 MG/ML IJ SOLN
INTRAMUSCULAR | Status: AC
  Filled 2012-09-22: qty 1

## 2012-09-22 MED ORDER — NEOMYCIN-BACITRACIN ZN-POLYMYX 5-400-10000 OP OINT
TOPICAL_OINTMENT | Freq: Two times a day (BID) | OPHTHALMIC | Status: DC
  Filled 2012-09-22: qty 3.5

## 2012-09-22 MED ORDER — LIDOCAINE HCL 4 % MT SOLN
OROMUCOSAL | Status: DC | PRN
  Administered 2012-09-22: 4 mL via TOPICAL

## 2012-09-22 MED ORDER — ROCURONIUM BROMIDE 100 MG/10ML IV SOLN
INTRAVENOUS | Status: DC | PRN
  Administered 2012-09-22: 20 mg via INTRAVENOUS

## 2012-09-22 MED ORDER — BACITRA-NEOMYCIN-POLYMYXIN-HC 1 % OP OINT
TOPICAL_OINTMENT | Freq: Two times a day (BID) | OPHTHALMIC | Status: DC
  Filled 2012-09-22: qty 3.5

## 2012-09-22 MED ORDER — 0.9 % SODIUM CHLORIDE (POUR BTL) OPTIME
TOPICAL | Status: DC | PRN
  Administered 2012-09-22: 1000 mL

## 2012-09-22 MED ORDER — ONDANSETRON HCL 4 MG/2ML IJ SOLN
INTRAMUSCULAR | Status: DC | PRN
  Administered 2012-09-22: 4 mg via INTRAVENOUS

## 2012-09-22 MED ORDER — LIDOCAINE HCL (CARDIAC) 20 MG/ML IV SOLN
INTRAVENOUS | Status: DC | PRN
  Administered 2012-09-22: 100 mg via INTRAVENOUS

## 2012-09-22 MED ORDER — ENOXAPARIN SODIUM 40 MG/0.4ML ~~LOC~~ SOLN
40.0000 mg | SUBCUTANEOUS | Status: DC
  Filled 2012-09-22 (×2): qty 0.4

## 2012-09-22 MED ORDER — HYDROMORPHONE HCL PF 1 MG/ML IJ SOLN
0.2500 mg | INTRAMUSCULAR | Status: DC | PRN
  Administered 2012-09-22 (×3): 0.5 mg via INTRAVENOUS

## 2012-09-22 MED ORDER — CEFAZOLIN SODIUM-DEXTROSE 2-3 GM-% IV SOLR
INTRAVENOUS | Status: DC | PRN
  Administered 2012-09-22: 2 g via INTRAVENOUS

## 2012-09-22 MED ORDER — HYDROMORPHONE HCL PF 1 MG/ML IJ SOLN: 0.2500 mg | INTRAMUSCULAR | Status: DC | PRN

## 2012-09-22 MED ORDER — LACTATED RINGERS IV SOLN
INTRAVENOUS | Status: DC
  Administered 2012-09-22: 11:00:00 via INTRAVENOUS

## 2012-09-22 MED ORDER — ONDANSETRON HCL 4 MG/2ML IJ SOLN: 4.0000 mg | Freq: Once | INTRAMUSCULAR | Status: DC | PRN

## 2012-09-22 MED ORDER — BSS IO SOLN
INTRAOCULAR | Status: DC | PRN
  Administered 2012-09-22: 15 mL via INTRAOCULAR

## 2012-09-22 MED ORDER — LIDOCAINE-EPINEPHRINE 1 %-1:100000 IJ SOLN
INTRAMUSCULAR | Status: DC | PRN
  Administered 2012-09-22: 10 mL

## 2012-09-22 MED ORDER — PROPOFOL 10 MG/ML IV BOLUS
INTRAVENOUS | Status: DC | PRN
  Administered 2012-09-22: 50 mg via INTRAVENOUS
  Administered 2012-09-22: 150 mg via INTRAVENOUS

## 2012-09-22 MED ORDER — FENTANYL CITRATE 0.05 MG/ML IJ SOLN
INTRAMUSCULAR | Status: DC | PRN
  Administered 2012-09-22: 50 ug via INTRAVENOUS
  Administered 2012-09-22: 100 ug via INTRAVENOUS
  Administered 2012-09-22 (×2): 50 ug via INTRAVENOUS
  Administered 2012-09-22: 100 ug via INTRAVENOUS
  Administered 2012-09-22 (×3): 50 ug via INTRAVENOUS

## 2012-09-22 MED ORDER — BACIT-POLY-NEO HC 1 % EX OINT
TOPICAL_OINTMENT | CUTANEOUS | Status: DC | PRN
  Administered 2012-09-22: 1 via TOPICAL

## 2012-09-22 MED ORDER — BACIT-POLY-NEO HC 1 % EX OINT
TOPICAL_OINTMENT | CUTANEOUS | Status: AC
  Filled 2012-09-22: qty 15

## 2012-09-22 MED ORDER — LACTATED RINGERS IV SOLN
INTRAVENOUS | Status: DC
  Administered 2012-09-22: 16:00:00 via INTRAVENOUS

## 2012-09-22 SURGICAL SUPPLY — 50 items
BIT DRILL UPPR FCE 0.9M 4 TWST (BIT) ×1 IMPLANT
CANISTER SUCTION 2500CC (MISCELLANEOUS) ×2 IMPLANT
CLOTH BEACON ORANGE TIMEOUT ST (SAFETY) ×2 IMPLANT
CONFORMERS SILICONE 5649 (OPHTHALMIC RELATED) IMPLANT
COVER SURGICAL LIGHT HANDLE (MISCELLANEOUS) ×2 IMPLANT
COVER TABLE BACK 60X90 (DRAPES) IMPLANT
CRADLE DONUT ADULT HEAD (MISCELLANEOUS) IMPLANT
DRAPE PROXIMA HALF (DRAPES) IMPLANT
DRILL UPPERFACE 0.9M 4MM TWIST (BIT) ×2
ELECT COATED BLADE 2.86 ST (ELECTRODE) ×2 IMPLANT
ELECT NEEDLE BLADE 2-5/6 (NEEDLE) ×2 IMPLANT
ELECT NEEDLE TIP 2.8 STRL (NEEDLE) IMPLANT
ELECT REM PT RETURN 9FT ADLT (ELECTROSURGICAL) ×2
ELECTRODE REM PT RTRN 9FT ADLT (ELECTROSURGICAL) ×1 IMPLANT
GLOVE ECLIPSE 8.0 STRL XLNG CF (GLOVE) ×2 IMPLANT
GOWN PREVENTION PLUS XLARGE (GOWN DISPOSABLE) ×2 IMPLANT
GOWN STRL NON-REIN LRG LVL3 (GOWN DISPOSABLE) ×4 IMPLANT
IMPL SURGICAL MEDPOR (Orthopedic Implant) ×1 IMPLANT
IMPLANT SURGICAL MEDPOR (Orthopedic Implant) ×2 IMPLANT
KIT BASIN OR (CUSTOM PROCEDURE TRAY) ×2 IMPLANT
KIT ROOM TURNOVER OR (KITS) ×2 IMPLANT
NS IRRIG 1000ML POUR BTL (IV SOLUTION) ×2 IMPLANT
PAD ARMBOARD 7.5X6 YLW CONV (MISCELLANEOUS) ×4 IMPLANT
PENCIL BUTTON HOLSTER BLD 10FT (ELECTRODE) ×2 IMPLANT
PLATE UPPER FACE .6X10H CURV (Plate) ×1 IMPLANT
PLATE UPPER FACE 10H CURVED (Plate) ×1 IMPLANT
PLATE UPPER FACE 4H ORBITAL (Plate) ×2 IMPLANT
SCISSORS WIRE ANG 4 3/4 DISP (INSTRUMENTS) IMPLANT
SCREW UPPERFACE 1.2X4M SLF TAP (Screw) ×14 IMPLANT
SCREW UPPERFACE 1.2X5M SL (Screw) ×4 IMPLANT
SCREW UPPERFACE 1.4X3MM (Screw) ×2 IMPLANT
STAPLER VISISTAT 35W (STAPLE) IMPLANT
SUT CHROMIC 3 0 PS 2 (SUTURE) IMPLANT
SUT CHROMIC 4 0 P 3 18 (SUTURE) IMPLANT
SUT CHROMIC 5 0 P 3 (SUTURE) ×2 IMPLANT
SUT ETHILON 4 0 CL P 3 (SUTURE) ×2 IMPLANT
SUT ETHILON 5 0 CL P 3 (SUTURE) ×2 IMPLANT
SUT ETHILON 5 0 P 3 18 (SUTURE) ×1
SUT ETHILON 6 0 P 1 (SUTURE) IMPLANT
SUT NYLON ETHILON 5-0 P-3 1X18 (SUTURE) ×1 IMPLANT
SUT PLAIN 5 0 P 3 18 (SUTURE) ×2 IMPLANT
SUT PLAIN 6 0 TG1408 (SUTURE) ×2 IMPLANT
SUT SILK 2 0 FS (SUTURE) ×2 IMPLANT
SUT SILK 4 0 PS 2 (SUTURE) ×2 IMPLANT
SUT VIC AB 5-0 P-3 18XBRD (SUTURE) ×2 IMPLANT
SUT VIC AB 5-0 P3 18 (SUTURE) ×2
TOWEL OR 17X24 6PK STRL BLUE (TOWEL DISPOSABLE) ×2 IMPLANT
TOWEL OR 17X26 10 PK STRL BLUE (TOWEL DISPOSABLE) ×2 IMPLANT
TRAY ENT MC OR (CUSTOM PROCEDURE TRAY) ×2 IMPLANT
WATER STERILE IRR 1000ML POUR (IV SOLUTION) ×2 IMPLANT

## 2012-09-22 NOTE — Transfer of Care (Signed)
Immediate Anesthesia Transfer of Care Note  Patient: [name]  Procedure(s) Performed: Procedure(s) with comments: OPEN REDUCTION INTERNAL FIXATION (ORIF) ZYGOMATIC FRACTURE ORBITAL FLOOR EXPLORATION WITH FROST STITCH  (Right) - Repair of lip laceration  Patient Location: PACU  Anesthesia Type:General  Level of Consciousness: awake, alert  and oriented  Airway & Oxygen Therapy: Patient Spontanous Breathing and Patient connected to nasal cannula oxygen  Post-op Assessment: Report given to PACU RN and Post -op Vital signs reviewed and stable  Post vital signs: Reviewed and stable  Complications: No apparent anesthesia complications

## 2012-09-22 NOTE — H&P (View-Only) (Signed)
09/18/2012 8:35 AM  [name], Tara [id number]     Temp:  [97.4 F (36.3 C)-99.6 F (37.6 C)] 98.5 F (36.9 C) (02/28 0745) Pulse Rate:  [64-100] 73 (02/28 0513) Resp:  [12-20] 18 (02/28 0513) BP: (104-135)/(57-80) 110/59 mmHg (02/28 0745) SpO2:  [93 %-100 %] 100 % (02/28 0513),     Intake/Output Summary (Last 24 hours) at 09/18/12 0835 Last data filed at 09/18/12 0514  Gross per 24 hour  Intake   2200 ml  Output   4245 ml  Net  -2045 ml    Results for orders placed during the hospital encounter of 09/16/12 (from the past 24 hour(s))  ANAEROBIC CULTURE     Status: None   Collection Time    09/17/12  7:28 PM      Result Value Range   Specimen Description VAGINA VAGINAL DISCHARGE     Special Requests PATIENT ON FOLLOWING ANCEF     Gram Stain       Value: RARE WBC PRESENT, PREDOMINANTLY MONONUCLEAR     ABUNDANT SQUAMOUS EPITHELIAL CELLS PRESENT     ABUNDANT GRAM NEGATIVE RODS     ABUNDANT GRAM POSITIVE RODS     FEW GRAM POSITIVE COCCI IN PAIRS   Culture PENDING     Report Status PENDING    CBC     Status: Abnormal   Collection Time    09/18/12  4:45 AM      Result Value Range   WBC 11.0 (*) 4.0 - 10.5 K/uL   RBC 3.74 (*) 3.87 - 5.11 MIL/uL   Hemoglobin 11.9 (*) 12.0 - 15.0 g/dL   HCT 33.8 (*) 36.0 - 46.0 %   MCV 90.4  78.0 - 100.0 fL   MCH 31.8  26.0 - 34.0 pg   MCHC 35.2  30.0 - 36.0 g/dL   RDW 12.9  11.5 - 15.5 %   Platelets 225  150 - 400 K/uL    SUBJECTIVE:  Pt reports RIGHT eye area pain.  Consult per Dr. Bevis, Ophth, reviewed.  OBJECTIVE:  No change in exam.    IMPRESSION:  Complex comminuted and displaced RIGHT zygoma and orbital fx's.  PLAN:  On schedule for ORIF Tuesday.  Will return Monday to discuss with pt and family.  No change in plans for now.  [name]       

## 2012-09-22 NOTE — Anesthesia Postprocedure Evaluation (Signed)
  Anesthesia Post-op Note  Patient: [name]  Procedure(s) Performed: Procedure(s) with comments: OPEN REDUCTION INTERNAL FIXATION (ORIF) ZYGOMATIC FRACTURE ORBITAL FLOOR EXPLORATION WITH FROST STITCH  (Right) - Repair of lip laceration  Patient Location: PACU  Anesthesia Type:General  Level of Consciousness: awake, oriented, sedated and patient cooperative  Airway and Oxygen Therapy: Patient Spontanous Breathing  Post-op Pain: mild  Post-op Assessment: Post-op Vital signs reviewed, Patient's Cardiovascular Status Stable, Respiratory Function Stable, Patent Airway, No signs of Nausea or vomiting and Pain level controlled  Post-op Vital Signs: stable  Complications: No apparent anesthesia complications
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
## 2012-09-22 NOTE — Progress Notes (Signed)
Doing better R periorbital ecchymosis is improving To OR today with Dr. Lazarus Salines Patient examined and I agree with the assessment and plan  Violeta Gelinas, MD, MPH, FACS Pager: [phone]  09/22/2012 9:06 AM

## 2012-09-22 NOTE — Progress Notes (Signed)
Day of Surgery  Subjective: Patient currently down getting MRI and is not available for examination. Per nursing, she has some facial swelling but incision looks good.  She has been sleeping much of the day so visual acuity has not been determined.  Objective: Vital signs in last 24 hours: Temp:  [97.1 F (36.2 C)-98.8 F (37.1 C)] 98.8 F (37.1 C) (03/04 1534) Pulse Rate:  [63-110] 74 (03/04 1534) Resp:  [8-51] 18 (03/04 1534) BP: (99-125)/(59-73) 124/68 mmHg (03/04 1534) SpO2:  [94 %-100 %] 99 % (03/04 1534) Last BM Date: 09/22/12  Intake/Output from previous day: 03/03 0701 - 03/04 0700 In: 490 [P.O.:490] Out: -  Intake/Output this shift: Total I/O In: 800 [I.V.:800] Out: -   Not available for exam.  Lab Results:   Recent Labs  09/21/12 0650 09/22/12 1608  WBC 6.9 11.3*  HGB 13.3 12.8  HCT 37.6 35.9*  PLT 251 269   BMET  Recent Labs  09/21/12 0650 09/22/12 1608  NA 134*  --   K 3.6  --   CL 98  --   CO2 26  --   GLUCOSE 89  --   BUN 7  --   CREATININE 0.52 0.54  CALCIUM 9.1  --    PT/INR No results found for this basename: LABPROT, INR,  in the last 72 hours ABG No results found for this basename: PHART, PCO2, PO2, HCO3,  in the last 72 hours  Studies/Results: No results found.  Anti-infectives: Anti-infectives   Start     Dose/Rate Route Frequency Ordered Stop   09/22/12 1545  ceFAZolin (ANCEF) 500 mg in dextrose 5 % 50 mL IVPB     500 mg 110 mL/hr over 30 Minutes Intravenous 3 times per day 09/22/12 1541     09/22/12 1000  ceFAZolin (ANCEF) IVPB 2 g/50 mL premix  Status:  Discontinued     2 g 100 mL/hr over 30 Minutes Intravenous  Once 09/21/12 1157 09/22/12 1541   09/18/12 0845  amoxicillin (AMOXIL) capsule 500 mg  Status:  Discontinued     500 mg Oral 3 times per day 09/18/12 0843 09/22/12 1541   09/17/12 1400  [MAR Hold]  ceFAZolin (ANCEF) IVPB 1 g/50 mL premix     (On MAR Hold since 09/17/12 1537)   1 g 100 mL/hr over 30 Minutes  Intravenous  Once 09/17/12 0707 09/17/12 1831   09/16/12 0600  clindamycin (CLEOCIN) IVPB 600 mg     600 mg 100 mL/hr over 30 Minutes Intravenous  Once 09/16/12 0549 09/16/12 0656      Assessment/Plan: s/p Procedure(s) with comments: OPEN REDUCTION INTERNAL FIXATION (ORIF) ZYGOMATIC FRACTURE ORBITAL FLOOR EXPLORATION WITH FROST STITCH  (Right) - Repair of lip laceration Per nursing, she is doing well after surgery.  She is not available for exam at this time.  Continue wound care.  LOS: 6 days    BATES, DWIGHT 09/22/2012

## 2012-09-22 NOTE — Interval H&P Note (Signed)
History and Physical Interval Note:  09/22/2012 11:34 AM  [name]  has presented today for surgery, with the diagnosis of RIGHT ZYGOMATA ORBIT FRACTURE   The various methods of treatment have been discussed with the patient and family. After consideration of risks, benefits and other options for treatment, the patient has consented to  Procedure(s): OPEN REDUCTION INTERNAL FIXATION (ORIF) ZYGOMATIC FRACTURE ORBITAL FLOOR EXPLORATION WITH FROST STITCH  (Right) as a surgical intervention .  The patient's history has been re-reviewed, patient re-examined, no change in status, stable for surgery.  I have re-reviewed the patient's chart and labs.  Questions were answered to the patient's satisfaction.     Flo Shanks
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
## 2012-09-22 NOTE — Progress Notes (Signed)
Physical Therapy Treatment Patient Details Name: [name] MRN: [id number] DOB: 07-29-1985 Today's Date: 09/22/2012 Time: 7829-5621 PT Time Calculation (min): 25 min  PT Assessment / Plan / Recommendation Comments on Treatment Session  Pt continues to make progress today and was able ambulate with Rt. PFRW without need for physical assist. She does demonstrate impulsive movements particularly with turning or backing walker which places her at risk for falling. Discussed home entry, pt will have to be carried up/down steps secondary to weightbearing status if she does not go to CIR first. Pt desires to go home as opposed to going to CIR, she will absolutely need 24/7 supervision secondary to impulsive gait and would have to have someone to carry her up/down the steps. Family would also benefit from PT education prior to D/C. If not able to complete the following pt will benefit from CIR to promote safe return home.     Follow Up Recommendations  CIR     Does the patient have the potential to tolerate intense rehabilitation   Yes  Barriers to Discharge  Steps to enter      Equipment Recommendations  Other (comment) (Right PFRW)    Recommendations for Other Services Rehab consult  Frequency Min 6X/week   Plan Discharge plan remains appropriate (unless family able to coordinate appropriate care)    Precautions / Restrictions Restrictions Weight Bearing Restrictions: Yes RLE Weight Bearing: Non weight bearing Other Position/Activity Restrictions: NWB through right wrist; [name] verbally OK'd use of PFRW     Pertinent Vitals/Pain 6/10 Rt. Eye pain    Mobility  Bed Mobility Bed Mobility: Supine to Sit;Sitting - Scoot to Edge of Bed;Sit to Supine Supine to Sit: 5: Supervision Sitting - Scoot to Edge of Bed: 6: Modified independent (Device/Increase time) Sit to Supine: 5: Supervision Details for Bed Mobility Assistance: pt. managed well with superivsion for  safety Transfers Transfers: Sit to Stand;Stand to Sit;Stand Pivot Transfers Sit to Stand: With upper extremity assist;Other (comment);From bed;5: Supervision (with left UE) Stand to Sit: With upper extremity assist;To chair/3-in-1;Other (comment);5: Supervision (left UE) Details for Transfer Assistance: VC's for needed for non wt bearing status, decreased speed of movements (decreased impulsivity), placement of hands for safety. Ambulation/Gait Ambulation/Gait Assistance: 4: Min guard Ambulation Distance (Feet): 100 Feet (with one seated rest) Assistive device: Right platform walker Ambulation/Gait Assistance Details: Verbal cues for safe speed and progression of walker.  Gait Pattern: Step-to pattern Stairs: No Wheelchair Mobility Wheelchair Mobility: No    PT Treatment Interventions:  Discussed home entry and safety in the home. Discussed need for 24 hour supervision. Pt reports understanding.   PT Goals Acute Rehab PT Goals PT Goal Formulation: With patient Time For Goal Achievement: 09/25/12 Potential to Achieve Goals: Good Pt will go Supine/Side to Sit: Independently PT Goal: Supine/Side to Sit - Progress: Progressing toward goal Pt will go Sit to Supine/Side: Independently PT Goal: Sit to Supine/Side - Progress: Progressing toward goal Pt will go Sit to Stand: with supervision PT Goal: Sit to Stand - Progress: Met Pt will go Stand to Sit: with supervision PT Goal: Stand to Sit - Progress: Met Pt will Transfer Bed to Chair/Chair to Bed: with supervision PT Transfer Goal: Bed to Chair/Chair to Bed - Progress: Met Pt will Go Up / Down Stairs: 3-5 stairs;with mod assist;with least restrictive assistive device PT Goal: Up/Down Stairs - Progress: Not met  Visit Information  Last PT Received On: 09/22/12 Assistance Needed: +1    Subjective Data  Subjective: "  I'm going to surgery in a little bit" Patient Stated Goal: Go home instead of rehab   Cognition  Cognition Overall  Cognitive Status: Impaired Area of Impairment: Attention Arousal/Alertness: Awake/alert Orientation Level: Oriented X4 / Intact Behavior During Session: Flat affect Current Attention Level: Sustained Attention - Other Comments: Decreased ability to attend to corrections  Cognition - Other Comments: Pt becomes irritable quickly, is quite impulsive at times with movements. Decreased safety awareness particularly with turns and backwards ambulation    Balance  Balance Balance Assessed: Yes Dynamic Standing Balance Dynamic Standing - Balance Support: No upper extremity supported Dynamic Standing - Level of Assistance: 4: Min assist Dynamic Standing - Balance Activities: Reaching for objects High Level Balance High Level Balance Activites: Side stepping;Backward walking High Level Balance Comments: Pt has decreased balance during these activities secondary to impulsive nature. With backing pt moves foot too far back leaving walker too far away from self despite cues for smaller hops.   End of Session PT - End of Session Equipment Utilized During Treatment: Gait belt Activity Tolerance: Patient tolerated treatment well Patient left: with call bell/phone within reach;in bed Nurse Communication: Mobility status;Weight bearing status   GP     Wilhemina Bonito 09/22/2012, 12:02 PM

## 2012-09-22 NOTE — Op Note (Signed)
09/22/2012  2:26 PM    Mickeal Needy  [id number]   Pre-Op Dx:  Right trimalar zygomatic fracture, comminuted and displaced, with complex fractures of the orbital medial and lateral walls and floor.  Post-op Dx: Same  Proc: ORIF right zygoma fracture including orbital floor exploration and repair   Surg:  Flo Shanks T MD  Anes:  GOT  EBL:  Minimal  Comp:  None  Findings:  Severe comminution and inferior displacement of the floor of the orbits. Medial displacement of the medial wall of the orbit. Posterior and superior displacement of the body of the zygoma with medial rotation at the infraorbital rim.  Procedure: With the patient in a comfortable supine position, general orotracheal anesthesia was induced without difficulty. At an appropriate level, the patient was placed in a slight sitting position. A saline moistened throat pack was placed. 1% Xylocaine with 1 100,000 epinephrine, 10 cc total, was infiltrated into the lateral brow region, into the infraorbital rim and lower lid, and into the upper gingivobuccal sulcus in anticipation of a possible canine fossa approach. Several minutes were allowed for this to take effect. A sterile preparation and draping of the right face was accomplished.  The CT scans were reviewed. The patient was reexamined with the findings as described above. A sterile corneal shield was placed.  A 2 cm incision, beveled with the hair follicles, was made over the palpable fracture at the frontozygomatic suture and carried down through skin, orbicularis oculi, and periosteum. The displaced fracture was identified. The periosteum was incised above and below the fracture and elevated laterally and medially into the orbit.  The lower lid was retracted. It was relatively taut consistent with her young age. An inferior lateral canthotomy was performed allowing more freedom to mobilize the lid. A post septal mucosal incision was made through the conjunctiva,  and then dissected down to the infraorbital rim. The conjunctiva was sewn to the upper lid to protect the globe after first removing the corneal shield. The periosteum was incised along the infraorbital rim. A stable segment was noted medially. Several comminuted pieces along the infraorbital rim were noted. The body of the zygoma was fairly mobile.  A 4 hole plate with a central gap was fitted to the frontozygomatic suture. A "joker" elevator was placed behind the body of the zygoma and with inferior lateral and anterior pressure, the zygoma was mobilized into a more anatomic position. The frontozygomatic plate was drilled and secured.  A 12 hole curved plate was bent slightly and then fitted along the infraorbital rim. The body of the zygoma was retracted anteriorly and laterally and the plate was drilled and secured on either end.  This reestablished the infraorbital rim.  Working under direct vision,  using malleable retractors and taking care to relax them every couple of minutes to allow good orbital perfusion, the periosteum was elevated from the severely comminuted and inferiorly displaced orbital floor. Clots and the antrum were visualized and removed. The mucosa of the antrum was not removed. Infraorbital nerve was identified and the dissection was carried along the plane of the infraorbital nerve. Laterally, the lateral wall is identified, medially, medial walls identified and was mobile, and posteriorly, the shelf was identified. Bone were removed as they were dissected loose.  At this point, a Medpor medial and inferior orbital floor prosthesis was brought into the field and was modified and placed entirely within the orbit to reconstruct the inferior orbital floor and medial wall. It was secured to  the infraorbital rim with a single screw. Reinspection revealed orbital contents to the in good position above the plate with no evidence of entrapment. The orbit was thoroughly irrigated with  saline.  The lateral wound was irrigated. The periosteum was closed with interrupted 5-0 Vicryl as was the orbicularis oculi muscle. Finally the skin was closed with a running simple 5-0 Ethilon.  The inferior lateral canthal tendon was reapproximated to the superior tendon using clear 4-0 nylon suture. A 6-0 plain gut was used to reapproximate the conjunctiva from medially to laterally.  Balanced salt solution was used to irrigate the conjunctival sac. Cortisporin Ophthalmic ointment was applied into the conjunctival sac and to the lateral incision.  The throat pack was removed. A small laceration of the lower lip was noted and closed with interrupted 4-0 chromic suture. This point the procedure was completed. The patient was returned to anesthesia, awakened, extubated, and transferred to recovery in stable condition.  Disposition: PACU to 6 N.  Plan:  Ice, elevation, analgesia, antibiosis, wound hygiene, avoidance of nose blowing. We'll check a CT scan to assess the reconstruction. Anticipate suture removal in 7 days.   Cephus Richer MD

## 2012-09-22 NOTE — Anesthesia Procedure Notes (Addendum)
Procedure Name: Intubation Date/Time: 09/22/2012 11:53 AM Performed by: Arlice Colt B Pre-anesthesia Checklist: Patient identified, Emergency Drugs available, Suction available, Patient being monitored and Timeout performed Patient Re-evaluated:Patient Re-evaluated prior to inductionOxygen Delivery Method: Circle system utilized Preoxygenation: Pre-oxygenation with 100% oxygen Intubation Type: IV induction Ventilation: Mask ventilation without difficulty Laryngoscope Size: Mac and 3 Grade View: Grade I Tube size: 7.0 mm Number of attempts: 1 Airway Equipment and Method: Stylet Placement Confirmation: ETT inserted through vocal cords under direct vision,  breath sounds checked- equal and bilateral and positive ETCO2 Secured at: 21 cm Tube secured with: Tape Dental Injury: Teeth and Oropharynx as per pre-operative assessment

## 2012-09-23 MED ORDER — PRENATAL MULTIVITAMIN CH: 1.0000 | ORAL_TABLET | Freq: Every day | ORAL | Status: DC

## 2012-09-23 MED ORDER — DSS 100 MG PO CAPS: 100.0000 mg | ORAL_CAPSULE | Freq: Two times a day (BID) | ORAL | Status: DC

## 2012-09-23 MED ORDER — OXYCODONE HCL ER 10 MG PO T12A: EXTENDED_RELEASE_TABLET | ORAL | Status: DC

## 2012-09-23 MED ORDER — POLYETHYLENE GLYCOL 3350 17 G PO PACK: 17.0000 g | PACK | Freq: Every day | ORAL | Status: DC

## 2012-09-23 MED ORDER — OXYCODONE-ACETAMINOPHEN 10-325 MG PO TABS: 1.0000 | ORAL_TABLET | Freq: Four times a day (QID) | ORAL | Status: DC | PRN

## 2012-09-23 MED ORDER — TRAMADOL HCL 50 MG PO TABS: ORAL_TABLET | ORAL | Status: DC

## 2012-09-23 MED ORDER — ENOXAPARIN SODIUM 40 MG/0.4ML ~~LOC~~ SOLN: 40.0000 mg | SUBCUTANEOUS | Status: DC

## 2012-09-23 MED ORDER — OXYCODONE HCL ER 15 MG PO T12A
15.0000 mg | EXTENDED_RELEASE_TABLET | Freq: Two times a day (BID) | ORAL | Status: DC
  Administered 2012-09-23: 15 mg via ORAL
  Filled 2012-09-23: qty 1
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
## 2012-09-23 NOTE — Progress Notes (Signed)
Pt discharged to home
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
## 2012-09-23 NOTE — Progress Notes (Signed)
According to the admission coordinator for CIR, the patient does not seem appropriate for rehab at this time.  Will work with PT for patient to go home.  This patient has been seen and I agree with the findings and treatment plan.  Marta Lamas. Gae Bon, MD, FACS [phone] (pager) [phone] (direct pager) Trauma Surgeon

## 2012-09-23 NOTE — Progress Notes (Signed)
Occupational Therapy Treatment Patient Details Name: [name] MRN: [id number] DOB: Mar 04, 1986 Today's Date: 09/23/2012 Time: 4098-1191 OT Time Calculation (min): 35 min  OT Assessment / Plan / Recommendation Comments on Treatment Session Pt much improved today with adls and cognition.  Pt safe to go home with 24 hour S.  Father present for session and caregiver ed completed.    Follow Up Recommendations  Home health OT    Barriers to Discharge       Equipment Recommendations  Tub/shower bench;Other (comment) (pt does NOT want BSC.)    Recommendations for Other Services    Frequency Min 3X/week   Plan Discharge plan needs to be updated    Precautions / Restrictions Precautions Precautions: Fall Precaution Comments: Pt NWB R side Restrictions Weight Bearing Restrictions: Yes RLE Weight Bearing: Non weight bearing Other Position/Activity Restrictions: NWB through right wrist; [name] verbally OK'd use of PFRW     Pertinent Vitals/Pain Pt with "great" pain in her face but says leg and arm are much better today.    ADL  Eating/Feeding: Modified independent Where Assessed - Eating/Feeding: Chair Grooming: Supervision/safety;Performed Where Assessed - Grooming: Supported standing Toilet Transfer: Research scientist (life sciences) Method: Sit to Barista: Comfort height toilet;Grab bars Therapist, nutritional and Hygiene: Performed;Supervision/safety Where Assessed - Engineer, mining and Hygiene: Standing Tub/Shower Transfer: Engineer, manufacturing Method: Passenger transport manager: Counsellor Used: Rolling walker;Other (comment) (platform walker) Transfers/Ambulation Related to ADLs: transfers with S. ADL Comments: Doing well with all adls.  Occasional min assist needed.  Cues to slow down.  pt mildly impulsive.    OT Diagnosis:    OT Problem List:    OT Treatment Interventions:     OT Goals Acute Rehab OT Goals OT Goal Formulation: With patient Time For Goal Achievement: 10/02/12 Potential to Achieve Goals: Good ADL Goals Pt Will Perform Grooming: with supervision;Standing at sink ADL Goal: Grooming - Progress: Met Pt Will Perform Lower Body Bathing: with supervision;Sit to stand from chair;Sit to stand from bed Pt Will Perform Lower Body Dressing: with supervision;Sit to stand from chair;Sit to stand from bed Pt Will Transfer to Toilet: with supervision;Ambulation;3-in-1 ADL Goal: Toilet Transfer - Progress: Met Pt Will Perform Toileting - Clothing Manipulation: with supervision;Standing ADL Goal: Toileting - Clothing Manipulation - Progress: Met Pt Will Perform Toileting - Hygiene: with supervision;Sit to stand from 3-in-1/toilet ADL Goal: Toileting - Hygiene - Progress: Met Pt Will Perform Tub/Shower Transfer: Ambulation;with min assist;with DME ADL Goal: Tub/Shower Transfer - Progress: Met Additional ADL Goal #1: Pt will participate in further cognitive evaluation Additional ADL Goal #2: Pt will particiapte in visual assesment after facial ORIF and pain subsides  Visit Information  Last OT Received On: 09/23/12 Assistance Needed: +1 PT/OT Co-Evaluation/Treatment: Yes    Subjective Data      Prior Functioning       Cognition  Cognition Overall Cognitive Status: Impaired Area of Impairment: Safety/judgement;Executive functioning Arousal/Alertness: Awake/alert Orientation Level: Oriented X4 / Intact Behavior During Session: [facility] for tasks performed Current Attention Level: Alternating Attention - Other Comments: Decreased ability to attend to corrections  Safety/Judgement: Decreased awareness of safety precautions Safety/Judgement - Other Comments: Pt mildly impulsive; just wants to finish the task but not necessarily in the safest way possible. Executive Functioning: Unsure if pt has high level functioning skills.   Should be looked at by [facility].  Cognition is greatly improved from admission.    Mobility  Bed Mobility Bed  Mobility: Supine to Sit;Sitting - Scoot to Edge of Bed;Sit to Supine Supine to Sit: 5: Supervision Sitting - Scoot to Edge of Bed: 6: Modified independent (Device/Increase time) Details for Bed Mobility Assistance: pt. managed well with superivsion for safety Transfers Transfers: Sit to Stand;Stand to Sit Sit to Stand: 5: Supervision;From bed Stand to Sit: 5: Supervision;To chair/3-in-1 Details for Transfer Assistance: occasional cues for hand placement.  Much better today in re: to WB status.    Exercises      Balance     End of Session OT - End of Session Activity Tolerance: Patient tolerated treatment well Patient left: in chair;with call bell/phone within reach Nurse Communication: Mobility status  GO     Hope Budds 09/23/2012, 12:57 PM [phone]

## 2012-09-23 NOTE — Progress Notes (Addendum)
Pt will be discharging to the following address: [address], [place], [place].   Best phone number to call is [phone]. Pt was enrolled into the [facility] program to have her meds filled one time for $3 copay per Rx.  HHPT arranged through [facility] per patient choice and insurance coverage restrictions.

## 2012-09-23 NOTE — Clinical Social Work Note (Signed)
Clinical Social Worker met with patient briefly to discuss patient needs at home and offer support.  Patient states that she has adequate transportation home and family plans to offer support as needed once home.  CSW provided patient with a sweater due to lack of clothes to go home.  Patient appreciative of support and unable to identify further needs.  Clinical Social Worker will sign off for now as social work intervention is no longer needed. Please consult [place] again if new need arises.  Macario Golds, [place] [phone]

## 2012-09-23 NOTE — Progress Notes (Signed)
09/23/2012 6:44 AM  [name] [id number]  Post-Op Day 1    Temp:  [97.1 F (36.2 C)-98.8 F (37.1 C)] 98.6 F (37 C) (03/05 0520) Pulse Rate:  [58-110] 58 (03/05 0520) Resp:  [8-51] 18 (03/05 0520) BP: (101-125)/(59-73) 111/70 mmHg (03/05 0520) SpO2:  [94 %-100 %] 98 % (03/05 0520),     Intake/Output Summary (Last 24 hours) at 09/23/12 0644 Last data filed at 09/22/12 2340  Gross per 24 hour  Intake    800 ml  Output   1250 ml  Net   -450 ml    Results for orders placed during the hospital encounter of 09/16/12 (from the past 24 hour(s))  CBC     Status: Abnormal   Collection Time    09/22/12  4:08 PM      Result Value Range   WBC 11.3 (*) 4.0 - 10.5 K/uL   RBC 4.03  3.87 - 5.11 MIL/uL   Hemoglobin 12.8  12.0 - 15.0 g/dL   HCT 40.9 (*) 81.1 - 91.4 %   MCV 89.1  78.0 - 100.0 fL   MCH 31.8  26.0 - 34.0 pg   MCHC 35.7  30.0 - 36.0 g/dL   RDW 78.2  95.6 - 21.3 %   Platelets 269  150 - 400 K/uL  CREATININE, SERUM     Status: None   Collection Time    09/22/12  4:08 PM      Result Value Range   Creatinine, Ser 0.54  0.50 - 1.10 mg/dL   GFR calc non Af Amer >90  >90 mL/min   GFR calc Af Amer >90  >90 mL/min   CT Maxillofacial shows good alignment of orbital floor reconstruction plate and zygoma.  SUBJECTIVE:  Mod-lg pain. Meds help.  No N,V.  + void.  OBJECTIVE:  Increased RIGHT periorbital ecchymosis and edema.  Increased chemosis.  Vision and EOM apparently intact OU.    IMPRESSION:  Satisfactory check  PLAN:  Ice, elevation.  No nose blowing.  OK to advance activities per Rehab program.  Would be OK for home discharge if this were her only injury.  Sutures out 1 week.  Flo Shanks

## 2012-09-23 NOTE — Progress Notes (Signed)
Rehab admissions - I met with patient.  She prefers to discharge home.  She has family who can assist.  She has 3 steps into her home.  I also talked to Dr. Riley Kill who feels that patient is doing to well to justify an inpatient rehab stay.  Recommend home with HH therapies at this time.  Call me for questions.  #[phone]

## 2012-09-24 ENCOUNTER — Encounter (HOSPITAL_COMMUNITY): Payer: Self-pay | Admitting: *Deleted

## 2012-09-28 ENCOUNTER — Encounter (HOSPITAL_COMMUNITY): Payer: Self-pay | Admitting: Otolaryngology

## 2012-10-16 LAB — FUNGUS CULTURE W SMEAR

## 2012-10-23 ENCOUNTER — Other Ambulatory Visit: Payer: Self-pay | Admitting: Obstetrics & Gynecology

## 2012-10-23 DIAGNOSIS — O3680X0 Pregnancy with inconclusive fetal viability, not applicable or unspecified: Secondary | ICD-10-CM
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
## 2012-11-02 ENCOUNTER — Other Ambulatory Visit: Payer: Self-pay

## 2012-11-09 ENCOUNTER — Ambulatory Visit (INDEPENDENT_AMBULATORY_CARE_PROVIDER_SITE_OTHER): Payer: Medicaid Other

## 2012-11-09 DIAGNOSIS — O3680X0 Pregnancy with inconclusive fetal viability, not applicable or unspecified: Secondary | ICD-10-CM

## 2012-11-09 DIAGNOSIS — O26849 Uterine size-date discrepancy, unspecified trimester: Secondary | ICD-10-CM
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
## 2012-11-11 LAB — US OB COMP LESS 14 WKS

## 2012-11-17 ENCOUNTER — Encounter: Payer: Self-pay | Admitting: *Deleted

## 2012-11-18 ENCOUNTER — Encounter: Payer: Self-pay | Admitting: Advanced Practice Midwife

## 2012-12-02 ENCOUNTER — Encounter: Payer: Self-pay | Admitting: Adult Health

## 2012-12-10 ENCOUNTER — Other Ambulatory Visit: Payer: Self-pay | Admitting: Advanced Practice Midwife

## 2012-12-10 ENCOUNTER — Other Ambulatory Visit (HOSPITAL_COMMUNITY)
Admission: RE | Admit: 2012-12-10 | Discharge: 2012-12-10 | Disposition: A | Discharge status: Home / Self Care | Payer: Medicaid Other | Source: Ambulatory Visit | Attending: Obstetrics and Gynecology | Admitting: Obstetrics and Gynecology

## 2012-12-10 ENCOUNTER — Ambulatory Visit (INDEPENDENT_AMBULATORY_CARE_PROVIDER_SITE_OTHER): Payer: Medicaid Other | Admitting: Advanced Practice Midwife

## 2012-12-10 ENCOUNTER — Encounter: Payer: Self-pay | Admitting: Advanced Practice Midwife

## 2012-12-10 VITALS — BP 108/50 | Wt 153.0 lb

## 2012-12-10 DIAGNOSIS — Z113 Encounter for screening for infections with a predominantly sexual mode of transmission: Secondary | ICD-10-CM | POA: Insufficient documentation

## 2012-12-10 DIAGNOSIS — Z1389 Encounter for screening for other disorder: Secondary | ICD-10-CM

## 2012-12-10 DIAGNOSIS — Z331 Pregnant state, incidental: Secondary | ICD-10-CM

## 2012-12-10 DIAGNOSIS — O99019 Anemia complicating pregnancy, unspecified trimester: Secondary | ICD-10-CM

## 2012-12-10 DIAGNOSIS — O09299 Supervision of pregnancy with other poor reproductive or obstetric history, unspecified trimester: Secondary | ICD-10-CM

## 2012-12-10 DIAGNOSIS — Z3490 Encounter for supervision of normal pregnancy, unspecified, unspecified trimester: Secondary | ICD-10-CM

## 2012-12-10 DIAGNOSIS — Z3482 Encounter for supervision of other normal pregnancy, second trimester: Secondary | ICD-10-CM

## 2012-12-10 DIAGNOSIS — Z01419 Encounter for gynecological examination (general) (routine) without abnormal findings: Secondary | ICD-10-CM | POA: Insufficient documentation

## 2012-12-10 DIAGNOSIS — F1910 Other psychoactive substance abuse, uncomplicated: Secondary | ICD-10-CM

## 2012-12-10 LAB — POCT URINALYSIS DIPSTICK
Blood, UA: NEGATIVE
Glucose, UA: NEGATIVE
Leukocytes, UA: NEGATIVE
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
## 2012-12-10 NOTE — Addendum Note (Signed)
Addended by: Jacklyn Shell on: 12/10/2012 03:43 PM   Modules accepted: Orders

## 2012-12-11 LAB — ABO AND RH: Rh Type: POSITIVE

## 2012-12-11 LAB — DRUG SCREEN, URINE, NO CONFIRMATION
Amphetamine Screen, Ur: NEGATIVE
Barbiturate Quant, Ur: NEGATIVE
Benzodiazepines.: NEGATIVE
Cocaine Metabolites: NEGATIVE
Creatinine,U: 11.2 mg/dL

## 2012-12-11 LAB — URINALYSIS
Bilirubin Urine: NEGATIVE
Glucose, UA: NEGATIVE mg/dL
Ketones, ur: NEGATIVE mg/dL
Specific Gravity, Urine: 1.007 (ref 1.005–1.030)
Urobilinogen, UA: 0.2 mg/dL (ref 0.0–1.0)
pH: 7 (ref 5.0–8.0)

## 2012-12-11 LAB — CBC
Hemoglobin: 10.5 g/dL — ABNORMAL LOW (ref 12.0–15.0)
MCH: 29.5 pg (ref 26.0–34.0)
Platelets: 229 10*3/uL (ref 150–400)
RBC: 3.56 MIL/uL — ABNORMAL LOW (ref 3.87–5.11)
WBC: 8.1 10*3/uL (ref 4.0–10.5)

## 2012-12-11 LAB — VARICELLA ZOSTER ANTIBODY, IGG: Varicella IgG: 486.2 Index — ABNORMAL HIGH (ref <135.00)

## 2012-12-11 LAB — HIV ANTIBODY (ROUTINE TESTING W REFLEX): HIV: NONREACTIVE

## 2012-12-11 LAB — OXYCODONE SCREEN, UA, RFLX CONFIRM: Oxycodone Screen, Ur: NEGATIVE ng/mL

## 2012-12-11 LAB — RUBELLA SCREEN: Rubella: 1.73 Index — ABNORMAL HIGH (ref <0.90)

## 2012-12-11 LAB — HEPATITIS B SURFACE ANTIGEN: Hepatitis B Surface Ag: NEGATIVE

## 2012-12-11 NOTE — Addendum Note (Signed)
Addended by: Colen Darling on: 12/11/2012 08:59 AM   Modules accepted: Orders

## 2012-12-12 LAB — URINE CULTURE: Colony Count: >=100000

## 2012-12-15 LAB — CYSTIC FIBROSIS DIAGNOSTIC STUDY

## 2012-12-16 LAB — AFP, QUAD SCREEN
AFP: 39.4 IU/mL
Age Alone: 1:914 {titer}
MoM for AFP: 0.87
Open Spina bifida: NEGATIVE
Trisomy 18 (Edward) Syndrome Interp.: 1:11800 {titer}
uE3 Value: 0.5 ng/mL

## 2012-12-17 ENCOUNTER — Telehealth: Payer: Self-pay | Admitting: Advanced Practice Midwife

## 2012-12-17 ENCOUNTER — Telehealth: Payer: Self-pay | Admitting: Obstetrics and Gynecology

## 2012-12-17 ENCOUNTER — Telehealth: Payer: Self-pay | Admitting: Obstetrics & Gynecology

## 2012-12-17 MED ORDER — NITROFURANTOIN MONOHYD MACRO 100 MG PO CAPS: 100.0000 mg | ORAL_CAPSULE | Freq: Two times a day (BID) | ORAL | Status: DC

## 2012-12-17 NOTE — Telephone Encounter (Signed)
Left message with person that answered phone for [facility] to give me a call

## 2012-12-17 NOTE — Telephone Encounter (Signed)
macrobid e prescribed 

## 2012-12-17 NOTE — Telephone Encounter (Signed)
Call transferred to Cathie Beams to discuss results

## 2012-12-20 NOTE — Progress Notes (Signed)
Pt informed of + UTI and instructed to pick up her rx for Macrobid.  Informed of increased DSR with Quad screen:  Options (harmony test, amnio) discussed.  Chooses [name] and will come in Monday morning to have it drawn.

## 2012-12-22 ENCOUNTER — Other Ambulatory Visit: Payer: Medicaid Other

## 2012-12-25 ENCOUNTER — Other Ambulatory Visit: Payer: Medicaid Other

## 2012-12-31 ENCOUNTER — Encounter: Payer: Self-pay | Admitting: Advanced Practice Midwife

## 2012-12-31 ENCOUNTER — Ambulatory Visit (INDEPENDENT_AMBULATORY_CARE_PROVIDER_SITE_OTHER): Payer: Medicaid Other

## 2012-12-31 ENCOUNTER — Other Ambulatory Visit: Payer: Self-pay | Admitting: Obstetrics & Gynecology

## 2012-12-31 ENCOUNTER — Ambulatory Visit (INDEPENDENT_AMBULATORY_CARE_PROVIDER_SITE_OTHER): Payer: Medicaid Other | Admitting: Advanced Practice Midwife

## 2012-12-31 VITALS — BP 90/60 | Wt 153.0 lb

## 2012-12-31 DIAGNOSIS — O24912 Unspecified diabetes mellitus in pregnancy, second trimester: Secondary | ICD-10-CM

## 2012-12-31 DIAGNOSIS — Z362 Encounter for other antenatal screening follow-up: Secondary | ICD-10-CM

## 2012-12-31 DIAGNOSIS — O10012 Pre-existing essential hypertension complicating pregnancy, second trimester: Secondary | ICD-10-CM

## 2012-12-31 DIAGNOSIS — O99019 Anemia complicating pregnancy, unspecified trimester: Secondary | ICD-10-CM

## 2012-12-31 DIAGNOSIS — Z3490 Encounter for supervision of normal pregnancy, unspecified, unspecified trimester: Secondary | ICD-10-CM

## 2012-12-31 DIAGNOSIS — F1920 Other psychoactive substance dependence, uncomplicated: Secondary | ICD-10-CM

## 2012-12-31 DIAGNOSIS — Z1389 Encounter for screening for other disorder: Secondary | ICD-10-CM

## 2012-12-31 DIAGNOSIS — O10019 Pre-existing essential hypertension complicating pregnancy, unspecified trimester: Secondary | ICD-10-CM

## 2012-12-31 DIAGNOSIS — O09299 Supervision of pregnancy with other poor reproductive or obstetric history, unspecified trimester: Secondary | ICD-10-CM

## 2012-12-31 DIAGNOSIS — O24919 Unspecified diabetes mellitus in pregnancy, unspecified trimester: Secondary | ICD-10-CM

## 2012-12-31 DIAGNOSIS — F1910 Other psychoactive substance abuse, uncomplicated: Secondary | ICD-10-CM

## 2012-12-31 NOTE — Progress Notes (Signed)
Had anatomy scan to day.  Harmony test pending.  Hands and feet go numb. May splint at night.  Routine questions about pregnancy answered.  F/U in 4 weeks for LROB. Pt Cant find note from [facility].  May look later

## 2012-12-31 NOTE — Progress Notes (Signed)
Needs paper for DMV.

## 2012-12-31 NOTE — Progress Notes (Signed)
U/S PERFORMED. ANAT SCREEN COMPLETE, ALL ANAT APPEARS NML., MEAS. C/W DATES, CX CLOSED 4.6 CM, FLUID NML, BILAT OVS SEEN

## 2013-01-05 ENCOUNTER — Telehealth: Payer: Self-pay | Admitting: Adult Health

## 2013-01-05 ENCOUNTER — Other Ambulatory Visit: Payer: Self-pay | Admitting: Adult Health

## 2013-01-05 DIAGNOSIS — O358XX0 Maternal care for other (suspected) fetal abnormality and damage, not applicable or unspecified: Secondary | ICD-10-CM

## 2013-01-05 NOTE — Telephone Encounter (Signed)
Left message to call in am  

## 2013-01-06 ENCOUNTER — Telehealth: Payer: Self-pay | Admitting: Adult Health

## 2013-01-06 NOTE — Telephone Encounter (Signed)
No answer

## 2013-01-07 ENCOUNTER — Telehealth: Payer: Self-pay | Admitting: *Deleted

## 2013-01-07 NOTE — Telephone Encounter (Signed)
Pt informed of Harmony test results of Low risk per Cyril Mourning, NP.

## 2013-01-16 ENCOUNTER — Inpatient Hospital Stay (HOSPITAL_COMMUNITY)
Admission: AD | Admit: 2013-01-16 | Discharge: 2013-01-16 | Disposition: A | Discharge status: Home / Self Care | Payer: Medicaid Other | Source: Ambulatory Visit | Attending: Obstetrics and Gynecology | Admitting: Obstetrics and Gynecology

## 2013-01-16 ENCOUNTER — Encounter (HOSPITAL_COMMUNITY): Payer: Self-pay | Admitting: *Deleted

## 2013-01-16 DIAGNOSIS — R51 Headache: Secondary | ICD-10-CM | POA: Insufficient documentation

## 2013-01-16 DIAGNOSIS — O2390 Unspecified genitourinary tract infection in pregnancy, unspecified trimester: Secondary | ICD-10-CM | POA: Insufficient documentation

## 2013-01-16 DIAGNOSIS — N390 Urinary tract infection, site not specified: Secondary | ICD-10-CM | POA: Insufficient documentation

## 2013-01-16 DIAGNOSIS — R109 Unspecified abdominal pain: Secondary | ICD-10-CM | POA: Insufficient documentation

## 2013-01-16 DIAGNOSIS — D649 Anemia, unspecified: Secondary | ICD-10-CM

## 2013-01-16 DIAGNOSIS — O2342 Unspecified infection of urinary tract in pregnancy, second trimester: Secondary | ICD-10-CM

## 2013-01-16 DIAGNOSIS — O99019 Anemia complicating pregnancy, unspecified trimester: Secondary | ICD-10-CM | POA: Insufficient documentation

## 2013-01-16 HISTORY — DX: Cocaine abuse, in remission: F14.11

## 2013-01-16 LAB — URINALYSIS, ROUTINE W REFLEX MICROSCOPIC
Glucose, UA: NEGATIVE mg/dL
Hgb urine dipstick: NEGATIVE
Ketones, ur: NEGATIVE mg/dL
Protein, ur: NEGATIVE mg/dL
Urobilinogen, UA: 0.2 mg/dL (ref 0.0–1.0)

## 2013-01-16 LAB — CBC
HCT: 25.6 % — ABNORMAL LOW (ref 36.0–46.0)
Hemoglobin: 9 g/dL — ABNORMAL LOW (ref 12.0–15.0)
MCV: 88.3 fL (ref 78.0–100.0)
WBC: 7.7 10*3/uL (ref 4.0–10.5)

## 2013-01-16 LAB — URINE MICROSCOPIC-ADD ON

## 2013-01-16 LAB — GLUCOSE, CAPILLARY: Glucose-Capillary: 91 mg/dL (ref 70–99)

## 2013-01-16 MED ORDER — CEPHALEXIN 500 MG PO CAPS: 500.0000 mg | ORAL_CAPSULE | Freq: Three times a day (TID) | ORAL | Status: DC

## 2013-01-16 MED ORDER — INTEGRA F 125-1 MG PO CAPS: 1.0000 | ORAL_CAPSULE | Freq: Every day | ORAL | Status: DC

## 2013-01-16 NOTE — MAU Note (Signed)
See triage note. Patient c/o dizziness and n/v is intermittent and increases at work. The lower abdominal is new but was for during work today. Her abdomen is soft and non-tender. She denies vaginal bleeding or lof. FHT obtained. Patient reports fetal movements.

## 2013-01-16 NOTE — Progress Notes (Signed)
Kim booker cnm notified of patient and her complaints. Orthostatic vital signs. Will come to MAU to see patient

## 2013-01-16 NOTE — MAU Provider Note (Signed)
History     CSN: [id number]  Arrival date and time: 01/16/13 2045   First Provider Initiated Contact with Patient 01/16/13 2232      Chief Complaint  Patient presents with  . Pelvic Pain   HPI  [name] is a 27 y.o. G72P1011 female @ [redacted]w[redacted]d who presents w/ report of intermittent pressure/pain/tightness lower abdomen while at work, eased off when she sat down. Feels like a dull period pain.  Also headache, hasn't taken anything for it.   Nausea and dizziness has increased in past few days.  Occurs 1-2x/day.  Mainly occurs while standing.  No report of bleeding or leaking of fluid.  +fetal movement.     Past Medical History  Diagnosis Date  . Smoker   . Anemia   . Other and unspecified ovarian cysts     Hx of   . History of cocaine abuse     opioid    Past Surgical History  Procedure Laterality Date  . Tonsillectomy    . Cesarean section    . Oophorectomy Right     mucinous cystadenoma  . Open reduction internal fixation (orif) distal radial fracture Right 09/17/2012    Procedure: OPEN REDUCTION INTERNAL FIXATION (ORIF) DISTAL RADIAL FRACTURE;  Surgeon: Budd Palmer, MD;  Location: [facility] OR;  Service: Orthopedics;  Laterality: Right;  . Orif ankle fracture Right 09/17/2012    Procedure: OPEN REDUCTION INTERNAL FIXATION (ORIF) ANKLE FRACTURE;  Surgeon: Budd Palmer, MD;  Location: [facility] OR;  Service: Orthopedics;  Laterality: Right;  . Orif zygomatic fracture Right 09/22/2012    Procedure: OPEN REDUCTION INTERNAL FIXATION (ORIF) ZYGOMATIC FRACTURE ORBITAL FLOOR EXPLORATION WITH FROST STITCH ;  Surgeon: Flo Shanks, MD;  Location: [facility] OR;  Service: ENT;  Laterality: Right;  Repair of lip laceration    Family History  Problem Relation Age of Onset  . Cancer Paternal Grandmother     breast  . Hypertension Other   . Diabetes Other   . Coronary artery disease Other   . Cancer Father     prostate and esophageal    History  Substance Use Topics  . Smoking status:  Current Every Day Smoker -- 1.00 packs/day for 10 years    Types: Cigarettes  . Smokeless tobacco: Not on file  . Alcohol Use: Yes     Comment: occasional; not now    Allergies: No Known Allergies  Prescriptions prior to admission  Medication Sig Dispense Refill  . acetaminophen (TYLENOL) 500 MG tablet Take 1,000 mg by mouth every 6 (six) hours as needed for pain.      . Homeopathic Products (ARNICARE EX) Apply 1 application topically every evening.      . Prenatal Vit-Fe Fumarate-FA (PRENATAL MULTIVITAMIN) TABS Take 1 tablet by mouth daily.  34 tablet  0    Review of Systems  Constitutional: Positive for chills. Negative for fever.  Gastrointestinal: Positive for nausea and abdominal pain (lower pelvic). Negative for vomiting.  Genitourinary: Negative for dysuria and hematuria.  Neurological: Positive for dizziness and headaches.  All other systems reviewed and are negative.   Physical Exam   Blood pressure 96/52, pulse 84, temperature 98.1 F (36.7 C), temperature source Oral, resp. rate 16, height 5\' 5"  (1.651 m), weight 72.122 kg (159 lb), last menstrual period 08/19/2012, SpO2 99.00%.  Physical Exam  Constitutional: She is oriented to person, place, and time. She appears well-developed and well-nourished. No distress.  HENT:  Head: Normocephalic.  Neck: Normal range of  motion. Neck supple.  Cardiovascular: Normal rate, regular rhythm and normal heart sounds.   Respiratory: Effort normal and breath sounds normal.  GI: Soft. There is tenderness (suprapubic).  Genitourinary: No bleeding around the vagina. No vaginal discharge found.  Musculoskeletal: Normal range of motion. She exhibits no edema.  Neurological: She is alert and oriented to person, place, and time.  Skin: Skin is warm and dry.   Dilation: Closed Effacement (%): Thick Cervical Position: Posterior Exam by:: Roney Marion, CNM  MAU Course  Procedures  Results for orders placed during the hospital  encounter of 01/16/13 (from the past 24 hour(s))  URINALYSIS, ROUTINE W REFLEX MICROSCOPIC     Status: Abnormal   Collection Time    01/16/13 10:05 PM      Result Value Range   Color, Urine YELLOW  YELLOW   APPearance CLEAR  CLEAR   Specific Gravity, Urine 1.010  1.005 - 1.030   pH 6.5  5.0 - 8.0   Glucose, UA NEGATIVE  NEGATIVE mg/dL   Hgb urine dipstick NEGATIVE  NEGATIVE   Bilirubin Urine NEGATIVE  NEGATIVE   Ketones, ur NEGATIVE  NEGATIVE mg/dL   Protein, ur NEGATIVE  NEGATIVE mg/dL   Urobilinogen, UA 0.2  0.0 - 1.0 mg/dL   Nitrite POSITIVE (*) NEGATIVE   Leukocytes, UA NEGATIVE  NEGATIVE  URINE MICROSCOPIC-ADD ON     Status: Abnormal   Collection Time    01/16/13 10:05 PM      Result Value Range   Squamous Epithelial / LPF FEW (*) RARE   WBC, UA 3-6  <3 WBC/hpf   Bacteria, UA FEW (*) RARE   Urine-Other MUCOUS PRESENT    CBC     Status: Abnormal   Collection Time    01/16/13 10:45 PM      Result Value Range   WBC 7.7  4.0 - 10.5 K/uL   RBC 2.90 (*) 3.87 - 5.11 MIL/uL   Hemoglobin 9.0 (*) 12.0 - 15.0 g/dL   HCT 16.1 (*) 09.6 - 04.5 %   MCV 88.3  78.0 - 100.0 fL   MCH 31.0  26.0 - 34.0 pg   MCHC 35.2  30.0 - 36.0 g/dL   RDW 40.9  81.1 - 91.4 %   Platelets 165  150 - 400 K/uL    Assessment and Plan  UTI in Pregnancy Anemia  Plan: Discharge to home RX Keflex TID x 7 days RX Integra QD Urine culture Follow-up at scheduled appointment  [facility] 01/16/2013, 10:34 PM

## 2013-01-16 NOTE — MAU Note (Signed)
PT SAYS WHILE SHE WAS AT WORK AT 645-720PM-    SHE HAD  A PAIN IN LOWER ABD - COME/ GO-  THIS IS 2ND TIME THIS HAS HAPPENED THIS WEEK.   FEELS  DIZZY- STARTED ON Tuesday- COMES / GOES - WHILE SHE IS AT WORK-  WHEN SHE SITS - IT GOES AWAY-     SAYS NAUSEA- HAS BEEN  ENTIRE PREG-    YESTERDAY- VOMIT X4 -  SHE DOES NOT HAVE ANY MED FOR NAUSEA,     SAYS HAS H/A   STARTED PAST WEEK IN EVENING AT WORK  WHEN SHE IS DIZZY.    SHE ALSO FEELS LIKE A PANIC ATTACK WHEN SHE IS DIZZY.    SHE HAS NOT CALLED OFFICE ABOUT THESE THINGS.

## 2013-01-17 ENCOUNTER — Encounter: Payer: Self-pay | Admitting: Obstetrics and Gynecology

## 2013-01-17 NOTE — MAU Provider Note (Signed)
Attestation of Attending Supervision of Advanced Practitioner (CNM/NP): Evaluation and management procedures were performed by the Advanced Practitioner under my supervision and collaboration.  I have reviewed the Advanced Practitioner's note and chart, and I agree with the management and plan.  Izabella Marcantel 01/17/2013 6:32 AM

## 2013-01-18 ENCOUNTER — Other Ambulatory Visit: Payer: Self-pay | Admitting: Family

## 2013-01-18 LAB — URINE CULTURE: Colony Count: 45000

## 2013-01-18 NOTE — Progress Notes (Signed)
Pt called RX not received by pharmacy.  Pharmacy called and RX for Keflex 500 mg TID x 7 days; Integra FE i qd #30, refills 3

## 2013-01-28 ENCOUNTER — Ambulatory Visit (INDEPENDENT_AMBULATORY_CARE_PROVIDER_SITE_OTHER): Payer: Medicaid Other | Admitting: Obstetrics & Gynecology

## 2013-01-28 ENCOUNTER — Encounter: Payer: Self-pay | Admitting: Advanced Practice Midwife

## 2013-01-28 ENCOUNTER — Other Ambulatory Visit: Payer: Self-pay | Admitting: Obstetrics & Gynecology

## 2013-01-28 VITALS — BP 90/50 | Wt 158.5 lb

## 2013-01-28 DIAGNOSIS — Z331 Pregnant state, incidental: Secondary | ICD-10-CM

## 2013-01-28 DIAGNOSIS — Z1389 Encounter for screening for other disorder: Secondary | ICD-10-CM

## 2013-01-28 DIAGNOSIS — F1920 Other psychoactive substance dependence, uncomplicated: Secondary | ICD-10-CM

## 2013-01-28 DIAGNOSIS — O99019 Anemia complicating pregnancy, unspecified trimester: Secondary | ICD-10-CM

## 2013-01-28 DIAGNOSIS — O09299 Supervision of pregnancy with other poor reproductive or obstetric history, unspecified trimester: Secondary | ICD-10-CM

## 2013-01-28 DIAGNOSIS — Z3482 Encounter for supervision of other normal pregnancy, second trimester: Secondary | ICD-10-CM

## 2013-01-28 LAB — POCT URINALYSIS DIPSTICK
Blood, UA: NEGATIVE
Nitrite, UA: NEGATIVE
Protein, UA: NEGATIVE

## 2013-01-28 MED ORDER — ONDANSETRON HCL 8 MG PO TABS: 8.0000 mg | ORAL_TABLET | Freq: Three times a day (TID) | ORAL | Status: DC | PRN

## 2013-01-28 MED ORDER — FERROUS SULFATE 325 (65 FE) MG PO TABS: 325.0000 mg | ORAL_TABLET | Freq: Every day | ORAL | Status: DC

## 2013-01-28 MED ORDER — CEPHALEXIN 500 MG PO CAPS: 500.0000 mg | ORAL_CAPSULE | Freq: Three times a day (TID) | ORAL | Status: DC
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
## 2013-01-28 NOTE — Patient Instructions (Addendum)
Nothing to eat or drink after midnight before your next appointment & plan to be here for 2 hours (for your sugar test).  Round Ligament Pain The round ligament is made up of muscle and fibrous tissue. It is attached to the uterus near the fallopian tube. The round ligament is located on both sides of the uterus and helps support the position of the uterus. It usually begins in the second trimester of pregnancy when the uterus comes out of the pelvis. The pain can come and go until the baby is delivered. Round ligament pain is not a serious problem and does not cause harm to the baby. CAUSE During pregnancy the uterus grows the most from the second trimester to delivery. As it grows, it stretches and slightly twists the round ligaments. When the uterus leans from one side to the other, the round ligament on the opposite side pulls and stretches. This can cause pain. SYMPTOMS  Pain can occur on one side or both sides. The pain is usually a short, sharp, and pinching-like. Sometimes it can be a dull, lingering and aching pain. The pain is located in the lower side of the abdomen or in the groin. The pain is internal and usually starts deep in the groin and moves up to the outside of the hip area. Pain can occur with:  Sudden change in position like getting out of bed or a chair.  Rolling over in bed.  Coughing or sneezing.  Walking too much.  Any type of physical activity. DIAGNOSIS  Your caregiver will make sure there are no serious problems causing the pain. When nothing serious is found, the symptoms usually indicate that the pain is from the round ligament. TREATMENT   Sit down and relax when the pain starts.  Flex your knees up to your belly.  Lay on your side with a pillow under your belly (abdomen) and another one between your legs.  Sit in a hot bath for 15 to 20 minutes or until the pain goes away. HOME CARE INSTRUCTIONS   Only take over-the-counter or prescriptions medicines  for pain, discomfort or fever as directed by your caregiver.  Sit and stand slowly.  Avoid long walks if it causes pain.  Stop or lessen your physical activities if it causes pain. SEEK MEDICAL CARE IF:   The pain does not go away with any of your treatment.  You need stronger medication for the pain.  You develop back pain that you did not have before with the side pain. SEEK IMMEDIATE MEDICAL CARE IF:   You develop a temperature of 102 F (38.9 C) or higher.  You develop uterine contractions.  You develop vaginal bleeding.  You develop nausea, vomiting or diarrhea.  You develop chills.  You have pain when you urinate. Document Released: 04/16/2008 Document Revised: 09/30/2011 Document Reviewed: 04/16/2008 [facility] Patient Information 2014 [place], [place].    Carpal Tunnel Syndrome The carpal tunnel is a narrow area located on the palm side of your wrist. The tunnel is formed by the wrist bones and ligaments. Nerves, blood vessels, and tendons pass through the carpal tunnel. Repeated wrist motion or certain diseases may cause swelling within the tunnel. This swelling pinches the main nerve in the wrist (median nerve) and causes the painful hand and arm condition called carpal tunnel syndrome. CAUSES   Repeated wrist motions.  Wrist injuries.  Certain diseases like arthritis, diabetes, alcoholism, hyperthyroidism, and kidney failure.  Obesity.  Pregnancy. SYMPTOMS   A "pins  and needles" feeling in your fingers or hand.  Tingling or numbness in your fingers or hand.  An aching feeling in your entire arm.  Wrist pain that goes up your arm to your shoulder.  Pain that goes down into your palm or fingers.  A weak feeling in your hands. DIAGNOSIS  Your caregiver will take your history and perform a physical exam. An electromyography test may be needed. This test measures electrical signals sent out by the muscles. The electrical signals are usually slowed by  carpal tunnel syndrome. You may also need X-rays. TREATMENT  Carpal tunnel syndrome may clear up by itself. Your caregiver may recommend a wrist splint or medicine such as a nonsteroidal anti-inflammatory medicine. Cortisone injections may help. Sometimes, surgery may be needed to free the pinched nerve.  HOME CARE INSTRUCTIONS   Take all medicine as directed by your caregiver. Only take over-the-counter or prescription medicines for pain, discomfort, or fever as directed by your caregiver.  If you were given a splint to keep your wrist from bending, wear it as directed. It is important to wear the splint at night. Wear the splint for as long as you have pain or numbness in your hand, arm, or wrist. This may take 1 to 2 months.  Rest your wrist from any activity that may be causing your pain. If your symptoms are work-related, you may need to talk to your employer about changing to a job that does not require using your wrist.  Put ice on your wrist after long periods of wrist activity.  Put ice in a plastic bag.  Place a towel between your skin and the bag.  Leave the ice on for 15-20 minutes, 3-4 times a day.  Keep all follow-up visits as directed by your caregiver. This includes any orthopedic referrals, physical therapy, and rehabilitation. Any delay in getting necessary care could result in a delay or failure of your condition to heal. SEEK IMMEDIATE MEDICAL CARE IF:   You have new, unexplained symptoms.  Your symptoms get worse and are not helped or controlled with medicines. MAKE SURE YOU:   Understand these instructions.  Will watch your condition.  Will get help right away if you are not doing well or get worse. Document Released: 07/05/2000 Document Revised: 09/30/2011 Document Reviewed: 05/24/2011 [facility] Patient Information 2014 [place], [place].

## 2013-01-28 NOTE — Addendum Note (Signed)
Addended by: Malachy Mood S on: 01/28/2013 12:40 PM   Modules accepted: Orders

## 2013-01-29 LAB — DRUG SCREEN, URINE, NO CONFIRMATION
Barbiturate Quant, Ur: NEGATIVE
Marijuana Metabolite: NEGATIVE
Methadone: NEGATIVE
Opiate Screen, Urine: NEGATIVE
Phencyclidine (PCP): NEGATIVE
Propoxyphene: NEGATIVE

## 2013-02-25 ENCOUNTER — Other Ambulatory Visit: Payer: Medicaid Other

## 2013-02-25 ENCOUNTER — Encounter: Payer: Medicaid Other | Admitting: Adult Health

## 2013-03-02 ENCOUNTER — Encounter (HOSPITAL_COMMUNITY): Payer: Self-pay | Admitting: *Deleted

## 2013-03-02 ENCOUNTER — Emergency Department (HOSPITAL_COMMUNITY)
Admission: EM | Admit: 2013-03-02 | Discharge: 2013-03-03 | Disposition: A | Discharge status: Home / Self Care | Payer: Medicaid Other | Attending: Emergency Medicine | Admitting: Emergency Medicine

## 2013-03-02 DIAGNOSIS — O99019 Anemia complicating pregnancy, unspecified trimester: Secondary | ICD-10-CM | POA: Insufficient documentation

## 2013-03-02 DIAGNOSIS — Z79899 Other long term (current) drug therapy: Secondary | ICD-10-CM | POA: Insufficient documentation

## 2013-03-02 DIAGNOSIS — Z3490 Encounter for supervision of normal pregnancy, unspecified, unspecified trimester: Secondary | ICD-10-CM

## 2013-03-02 DIAGNOSIS — O219 Vomiting of pregnancy, unspecified: Secondary | ICD-10-CM | POA: Insufficient documentation

## 2013-03-02 DIAGNOSIS — Z9189 Other specified personal risk factors, not elsewhere classified: Secondary | ICD-10-CM | POA: Insufficient documentation

## 2013-03-02 DIAGNOSIS — Z9889 Other specified postprocedural states: Secondary | ICD-10-CM | POA: Insufficient documentation

## 2013-03-02 DIAGNOSIS — O9989 Other specified diseases and conditions complicating pregnancy, childbirth and the puerperium: Secondary | ICD-10-CM | POA: Insufficient documentation

## 2013-03-02 DIAGNOSIS — O99330 Smoking (tobacco) complicating pregnancy, unspecified trimester: Secondary | ICD-10-CM | POA: Insufficient documentation

## 2013-03-02 DIAGNOSIS — R1011 Right upper quadrant pain: Secondary | ICD-10-CM | POA: Insufficient documentation

## 2013-03-02 DIAGNOSIS — Z8742 Personal history of other diseases of the female genital tract: Secondary | ICD-10-CM | POA: Insufficient documentation

## 2013-03-02 LAB — CBC WITH DIFFERENTIAL/PLATELET
Basophils Relative: 0 % (ref 0–1)
Eosinophils Absolute: 0.1 10*3/uL (ref 0.0–0.7)
Eosinophils Relative: 2 % (ref 0–5)
Lymphs Abs: 2.3 10*3/uL (ref 0.7–4.0)
MCH: 31.8 pg (ref 26.0–34.0)
MCHC: 34.6 g/dL (ref 30.0–36.0)
MCV: 91.8 fL (ref 78.0–100.0)
Monocytes Relative: 8 % (ref 3–12)
Platelets: 188 10*3/uL (ref 150–400)
RBC: 3.05 MIL/uL — ABNORMAL LOW (ref 3.87–5.11)

## 2013-03-02 LAB — COMPREHENSIVE METABOLIC PANEL
BUN: 6 mg/dL (ref 6–23)
Calcium: 8.5 mg/dL (ref 8.4–10.5)
GFR calc Af Amer: >90 mL/min (ref >90)
Glucose, Bld: 94 mg/dL (ref 70–99)
Sodium: 135 mEq/L (ref 135–145)
Total Protein: 6 g/dL (ref 6.0–8.3)

## 2013-03-02 LAB — LIPASE, BLOOD: Lipase: 24 U/L (ref 11–59)

## 2013-03-02 LAB — URINALYSIS, ROUTINE W REFLEX MICROSCOPIC
Leukocytes, UA: NEGATIVE
Nitrite: POSITIVE — AB
Specific Gravity, Urine: 1.015 (ref 1.005–1.030)
Urobilinogen, UA: 0.2 mg/dL (ref 0.0–1.0)

## 2013-03-02 LAB — URINE MICROSCOPIC-ADD ON

## 2013-03-02 MED ORDER — MORPHINE SULFATE 2 MG/ML IJ SOLN
2.0000 mg | Freq: Once | INTRAMUSCULAR | Status: AC
  Administered 2013-03-02: 2 mg via INTRAVENOUS
  Filled 2013-03-02: qty 1

## 2013-03-02 MED ORDER — SODIUM CHLORIDE 0.9 % IV SOLN: 1000.0000 mL | INTRAVENOUS | Status: DC

## 2013-03-02 MED ORDER — SODIUM CHLORIDE 0.9 % IV SOLN
1000.0000 mL | Freq: Once | INTRAVENOUS | Status: AC
  Administered 2013-03-02: 1000 mL via INTRAVENOUS

## 2013-03-02 MED ORDER — ONDANSETRON HCL 4 MG/2ML IJ SOLN
4.0000 mg | Freq: Once | INTRAMUSCULAR | Status: AC
  Administered 2013-03-02: 4 mg via INTRAVENOUS
  Filled 2013-03-02: qty 2

## 2013-03-02 NOTE — ED Provider Notes (Signed)
CSN: [id number]     Arrival date & time 03/02/13  2155 History  This chart was scribed for Ward Givens, MD by Ardelia Mems, ED Scribe. This patient was seen in room APA01/APA01 and the patient's care was started at 10:41 PM.    Chief Complaint  Patient presents with  . Abdominal Pain  . Routine Prenatal Visit    28 weeks  . Emesis During Pregnancy    The history is provided by the patient. No language interpreter was used.    HPI Comments: [name] is a 27 y.o. Female, G2P1Ab0, who is [redacted] weeks pregnant who presents to the Emergency Department complaining of gradual onset, gradually worsening, intermittent, "sharp" "8/10" abdominal pain that occasionally radiates to her back over the past 3 weeks. Pt states that she was seen at [facility] last month for the same. Pt states that this is her second pregnancy. She states that during her 1st pregnancy, she had similar pain and she also had a laparoscopic surgery to remove an ovary due to a cyst. She does not relate her pain to diet. She states that is also having emesis about 2 times a day, her mother states up to 4-5 times a day. She denies diarrhea, fever, dysuria, urinary frequency or any other symptoms. She denies lower abdominal pain, vaginal bleeding or discharge.  Pt states that she smokes about 0.5 packs/day and she denies alcohol use.  OB Dr Emelda Fear  Past Medical History  Diagnosis Date  . Smoker   . Anemia   . Other and unspecified ovarian cysts     Hx of   . History of cocaine abuse     opioid   Past Surgical History  Procedure Laterality Date  . Tonsillectomy    . Cesarean section    . Oophorectomy Right     mucinous cystadenoma  . Open reduction internal fixation (orif) distal radial fracture Right 09/17/2012    Procedure: OPEN REDUCTION INTERNAL FIXATION (ORIF) DISTAL RADIAL FRACTURE;  Surgeon: Budd Palmer, MD;  Location: [facility] OR;  Service: Orthopedics;  Laterality: Right;  . Orif ankle fracture Right  09/17/2012    Procedure: OPEN REDUCTION INTERNAL FIXATION (ORIF) ANKLE FRACTURE;  Surgeon: Budd Palmer, MD;  Location: [facility] OR;  Service: Orthopedics;  Laterality: Right;  . Orif zygomatic fracture Right 09/22/2012    Procedure: OPEN REDUCTION INTERNAL FIXATION (ORIF) ZYGOMATIC FRACTURE ORBITAL FLOOR EXPLORATION WITH FROST STITCH ;  Surgeon: Flo Shanks, MD;  Location: [facility] OR;  Service: ENT;  Laterality: Right;  Repair of lip laceration   Family History  Problem Relation Age of Onset  . Cancer Paternal Grandmother     breast  . Hypertension Other   . Diabetes Other   . Coronary artery disease Other   . Cancer Father     prostate and esophageal   History  Substance Use Topics  . Smoking status: Current Every Day Smoker -- 1.00 packs/day for 10 years    Types: Cigarettes  . Smokeless tobacco: Not on file  . Alcohol Use: Yes     Comment: occasional; not now  employed   OB History   Grav Para Term Preterm Abortions TAB SAB Ect Mult Living   3 1 1  1     1      Review of Systems  Constitutional: Negative for fever.  Gastrointestinal: Positive for vomiting and abdominal pain. Negative for diarrhea.  Genitourinary: Negative for dysuria and frequency.  All other systems reviewed and  are negative.    Allergies  Review of patient's allergies indicates no known allergies.  Home Medications   Current Outpatient Rx  Name  Route  Sig  Dispense  Refill  . acetaminophen (TYLENOL) 500 MG tablet   Oral   Take 1,000 mg by mouth every 6 (six) hours as needed for pain.         . Fe Fum-FePoly-FA-Vit C-Vit B3 (INTEGRA F) 125-1 MG CAPS   Oral   Take 1 tablet by mouth daily.   30 capsule   1   . ferrous sulfate 325 (65 FE) MG tablet   Oral   Take 1 tablet (325 mg total) by mouth daily with breakfast.   30 tablet   3   . Homeopathic Products (ARNICARE EX)   Apply externally   Apply 1 application topically every evening.         . ondansetron (ZOFRAN) 8 MG tablet   Oral    Take 1 tablet (8 mg total) by mouth every 8 (eight) hours as needed for nausea.   24 tablet   1   . Prenatal Vit-Fe Fumarate-FA (PRENATAL MULTIVITAMIN) TABS   Oral   Take 1 tablet by mouth daily.   34 tablet   0    Triage Vitals: BP 100/55  Pulse 70  Temp(Src) 98.2 F (36.8 C) (Oral)  Resp 18  Ht 5\' 7"  (1.702 m)  Wt 161 lb 2 oz (73.086 kg)  BMI 25.23 kg/m2  SpO2 100%  LMP 08/19/2012  Vital signs normal    Physical Exam  Nursing note and vitals reviewed. Constitutional: She is oriented to person, place, and time. She appears well-developed and well-nourished.  Non-toxic appearance. She does not appear ill. No distress.  HENT:  Head: Normocephalic and atraumatic.  Right Ear: External ear normal.  Left Ear: External ear normal.  Nose: Nose normal. No mucosal edema or rhinorrhea.  Mouth/Throat: Oropharynx is clear and moist and mucous membranes are normal. No dental abscesses or edematous.  Eyes: Conjunctivae and EOM are normal. Pupils are equal, round, and reactive to light.  Neck: Normal range of motion and full passive range of motion without pain. Neck supple.  Cardiovascular: Normal rate, regular rhythm and normal heart sounds.  Exam reveals no gallop and no friction rub.   No murmur heard. Pulmonary/Chest: Effort normal and breath sounds normal. No respiratory distress. She has no wheezes. She has no rhonchi. She has no rales. She exhibits no tenderness and no crepitus.  Abdominal: Soft. Normal appearance and bowel sounds are normal. She exhibits no distension. There is tenderness. There is no rebound and no guarding.  Tenderness to palpation in the RUQ. Fetal HR is in the 140s. Uterus is nontender and c/w datesNo contractions seen on the monitor.  Musculoskeletal: Normal range of motion. She exhibits no edema and no tenderness.  Moves all extremities well.   Neurological: She is alert and oriented to person, place, and time. She has normal strength. No cranial nerve  deficit.  Skin: Skin is warm, dry and intact. No rash noted. No erythema. No pallor.  Psychiatric: She has a normal mood and affect. Her speech is normal and behavior is normal. Her mood appears not anxious.    ED Course   Medications  0.9 %  sodium chloride infusion (1,000 mLs Intravenous New Bag/Given 03/02/13 2305)    Followed by  0.9 %  sodium chloride infusion (not administered)  morphine 2 MG/ML injection 2 mg (2 mg Intravenous Given  03/02/13 2307)  ondansetron (ZOFRAN) injection 4 mg (4 mg Intravenous Given 03/02/13 2307)   Procedures (including critical care time)  DIAGNOSTIC STUDIES: Oxygen Saturation is 100% on RA, normal by my interpretation.    COORDINATION OF CARE: 10:46 PM- Pt advised of plan for diagnostic lab work, along with plan to receive medications in the ED and pt agrees.  Pain is better, will return for outpatient [place] to check for gallstones.   Results for orders placed during the hospital encounter of 03/02/13  CBC WITH DIFFERENTIAL      Result Value Range   WBC 6.9  4.0 - 10.5 K/uL   RBC 3.05 (*) 3.87 - 5.11 MIL/uL   Hemoglobin 9.7 (*) 12.0 - 15.0 g/dL   HCT 03.4 (*) 74.2 - 59.5 %   MCV 91.8  78.0 - 100.0 fL   MCH 31.8  26.0 - 34.0 pg   MCHC 34.6  30.0 - 36.0 g/dL   RDW 63.8  75.6 - 43.3 %   Platelets 188  150 - 400 K/uL   Neutrophils Relative % 57  43 - 77 %   Neutro Abs 3.9  1.7 - 7.7 K/uL   Lymphocytes Relative 33  12 - 46 %   Lymphs Abs 2.3  0.7 - 4.0 K/uL   Monocytes Relative 8  3 - 12 %   Monocytes Absolute 0.6  0.1 - 1.0 K/uL   Eosinophils Relative 2  0 - 5 %   Eosinophils Absolute 0.1  0.0 - 0.7 K/uL   Basophils Relative 0  0 - 1 %   Basophils Absolute 0.0  0.0 - 0.1 K/uL  COMPREHENSIVE METABOLIC PANEL      Result Value Range   Sodium 135  135 - 145 mEq/L   Potassium 4.0  3.5 - 5.1 mEq/L   Chloride 104  96 - 112 mEq/L   CO2 22  19 - 32 mEq/L   Glucose, Bld 94  70 - 99 mg/dL   BUN 6  6 - 23 mg/dL   Creatinine, Ser 2.95 (*) 0.50 - 1.10  mg/dL   Calcium 8.5  8.4 - 18.8 mg/dL   Total Protein 6.0  6.0 - 8.3 g/dL   Albumin 2.9 (*) 3.5 - 5.2 g/dL   AST 17  0 - 37 U/L   ALT 8  0 - 35 U/L   Alkaline Phosphatase 64  39 - 117 U/L   Total Bilirubin 0.2 (*) 0.3 - 1.2 mg/dL   GFR calc non Af Amer >90  >90 mL/min   GFR calc Af Amer >90  >90 mL/min  LIPASE, BLOOD      Result Value Range   Lipase 24  11 - 59 U/L  URINALYSIS, ROUTINE W REFLEX MICROSCOPIC      Result Value Range   Color, Urine YELLOW  YELLOW   APPearance HAZY (*) CLEAR   Specific Gravity, Urine 1.015  1.005 - 1.030   pH 8.0  5.0 - 8.0   Glucose, UA NEGATIVE  NEGATIVE mg/dL   Hgb urine dipstick NEGATIVE  NEGATIVE   Bilirubin Urine NEGATIVE  NEGATIVE   Ketones, ur NEGATIVE  NEGATIVE mg/dL   Protein, ur NEGATIVE  NEGATIVE mg/dL   Urobilinogen, UA 0.2  0.0 - 1.0 mg/dL   Nitrite POSITIVE (*) NEGATIVE   Leukocytes, UA NEGATIVE  NEGATIVE  URINE MICROSCOPIC-ADD ON      Result Value Range   WBC, UA 0-2  <3 WBC/hpf   Bacteria, UA MANY (*) RARE  1. RUQ pain   2. Pregnancy     New Prescriptions   HYDROCODONE-ACETAMINOPHEN (NORCO/VICODIN) 5-325 MG PER TABLET    Take 1 or 2 po Q 6hrs for pain   PROMETHAZINE (PHENERGAN) 25 MG TABLET    Take 1 tablet (25 mg total) by mouth every 8 (eight) hours as needed for nausea.    Plan discharge   Devoria Albe, MD, FACEP    MDM    I personally performed the services described in this documentation, which was scribed in my presence. The recorded information has been reviewed and considered.  Devoria Albe, MD, Armando Gang    Ward Givens, MD 03/03/13 [phone]

## 2013-03-02 NOTE — ED Notes (Signed)
Pt states this abd pain is ongoing for months and was seen last month at [facility] for the same. Pt states she has seen her obgyn for same as well and put pain related to pregnancy. Pt states she vomiting profusely everyday. Pt placed on monitor and rapid response nurse called.

## 2013-03-02 NOTE — ED Notes (Signed)
Pt c/o sharp pain to abd pain x few weeks, worse at work, states [redacted] weeks pregnant, last visit on 8/4 and states pregnancy is going well, pt states that this is second pregnancy, denies vaginal discharge or bleeding

## 2013-03-03 ENCOUNTER — Ambulatory Visit (HOSPITAL_COMMUNITY)
Admit: 2013-03-03 | Discharge: 2013-03-03 | Disposition: A | Discharge status: Home / Self Care | Payer: Medicaid Other | Attending: Emergency Medicine | Admitting: Emergency Medicine

## 2013-03-03 DIAGNOSIS — O9989 Other specified diseases and conditions complicating pregnancy, childbirth and the puerperium: Secondary | ICD-10-CM | POA: Insufficient documentation

## 2013-03-03 DIAGNOSIS — R1011 Right upper quadrant pain: Secondary | ICD-10-CM

## 2013-03-03 DIAGNOSIS — R109 Unspecified abdominal pain: Secondary | ICD-10-CM | POA: Insufficient documentation

## 2013-03-03 DIAGNOSIS — M549 Dorsalgia, unspecified: Secondary | ICD-10-CM | POA: Insufficient documentation

## 2013-03-03 MED ORDER — PROMETHAZINE HCL 25 MG PO TABS: 25.0000 mg | ORAL_TABLET | Freq: Three times a day (TID) | ORAL | Status: DC | PRN

## 2013-03-03 MED ORDER — HYDROCODONE-ACETAMINOPHEN 5-325 MG PO TABS: ORAL_TABLET | ORAL | Status: DC

## 2013-03-03 NOTE — ED Notes (Signed)
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

## 2013-03-04 LAB — URINE CULTURE: Colony Count: >=100000

## 2013-03-05 ENCOUNTER — Telehealth (HOSPITAL_COMMUNITY): Payer: Self-pay | Admitting: *Deleted

## 2013-03-05 NOTE — Progress Notes (Signed)
ED Antimicrobial Stewardship Positive Culture Follow Up   [name] is an 27 y.o. female who presented to [facility] on 03/02/2013 with a chief complaint of  Chief Complaint  Patient presents with  . Abdominal Pain  . Routine Prenatal Visit    28 weeks  . Emesis During Pregnancy    Recent Results (from the past 720 hour(s))  URINE CULTURE     Status: None   Collection Time    03/02/13 10:55 PM      Result Value Range Status   Specimen Description URINE, CLEAN CATCH   Final   Special Requests NONE   Final   Culture  Setup Time     Final   Value: 03/02/2013 23:30     Performed at [facility] Count     Final   Value: >=100,000 COLONIES/ML     Performed at [facility]   Culture     Final   Value: ESCHERICHIA COLI     Performed at [facility]   Report Status 03/04/2013 FINAL   Final   Organism ID, Bacteria ESCHERICHIA COLI   Final     [x]  Patient discharged originally without antimicrobial agent and treatment is now indicated  New antibiotic prescription: Macrobid 100mg  po BID x 5 days  ED Provider: Francee Piccolo, PAC   [name] 03/05/2013, 11:39 AM Infectious Diseases Pharmacist Phone# [phone]

## 2013-03-05 NOTE — ED Notes (Signed)
Post ED Visit - Positive Culture Follow-up: Successful Patient Follow-Up  Culture assessed and recommendations reviewed by: []  Wes Dulaney, Pharm.D., BCPS [x]  Celedonio Miyamoto, Pharm.D., BCPS []  Georgina Pillion, Pharm.D., BCPS []  [place], [address].D., BCPS, AAHIVP []  Estella Husk, Pharm.D., BCPS, AAHIVP  Positive urine culture  []  Patient discharged without antimicrobial prescription and treatment is now indicated [x]  Organism is resistant to prescribed ED discharge antimicrobial []  Patient with positive blood cultures  Changes discussed with ED provider: Francee Piccolo  Larena Sox 03/05/2013, 1:08 PM

## 2013-03-06 ENCOUNTER — Telehealth (HOSPITAL_COMMUNITY): Payer: Self-pay | Admitting: Emergency Medicine

## 2013-03-07 ENCOUNTER — Telehealth (HOSPITAL_COMMUNITY): Payer: Self-pay | Admitting: Emergency Medicine

## 2013-03-08 NOTE — ED Notes (Signed)
Post ED Visit - Positive Culture Follow-up: Unsuccessful Patient Follow-up   Unable to contact patient after 3 attempts, letter will be sent to address on file  Larena Sox 03/08/2013, 3:36 PM

## 2013-03-18 ENCOUNTER — Ambulatory Visit (INDEPENDENT_AMBULATORY_CARE_PROVIDER_SITE_OTHER): Payer: Medicaid Other | Admitting: Obstetrics & Gynecology

## 2013-03-18 ENCOUNTER — Other Ambulatory Visit: Payer: Medicaid Other

## 2013-03-18 ENCOUNTER — Encounter: Payer: Self-pay | Admitting: Obstetrics & Gynecology

## 2013-03-18 VITALS — BP 90/60 | Temp 98.1°F | Wt 162.0 lb

## 2013-03-18 DIAGNOSIS — Z3480 Encounter for supervision of other normal pregnancy, unspecified trimester: Secondary | ICD-10-CM

## 2013-03-18 DIAGNOSIS — Z331 Pregnant state, incidental: Secondary | ICD-10-CM

## 2013-03-18 DIAGNOSIS — O26829 Pregnancy related peripheral neuritis, unspecified trimester: Secondary | ICD-10-CM

## 2013-03-18 DIAGNOSIS — Z3483 Encounter for supervision of other normal pregnancy, third trimester: Secondary | ICD-10-CM

## 2013-03-18 DIAGNOSIS — O09299 Supervision of pregnancy with other poor reproductive or obstetric history, unspecified trimester: Secondary | ICD-10-CM

## 2013-03-18 DIAGNOSIS — Z1389 Encounter for screening for other disorder: Secondary | ICD-10-CM

## 2013-03-18 DIAGNOSIS — O99019 Anemia complicating pregnancy, unspecified trimester: Secondary | ICD-10-CM

## 2013-03-18 LAB — POCT URINALYSIS DIPSTICK
Glucose, UA: NEGATIVE
Ketones, UA: NEGATIVE

## 2013-03-18 MED ORDER — NITROFURANTOIN MONOHYD MACRO 100 MG PO CAPS: 100.0000 mg | ORAL_CAPSULE | Freq: Two times a day (BID) | ORAL | Status: DC

## 2013-03-18 NOTE — Progress Notes (Signed)
Macrobid prescribed BP weight and urine results all reviewed and noted. Patient reports good fetal movement, denies any bleeding and no rupture of membranes symptoms or regular contractions. Patient is without complaints. All questions were answered.

## 2013-03-18 NOTE — Progress Notes (Signed)
C/C Back pain and frequent urination. Nitrate in urine, will since for culture C&S. PN2 Today.

## 2013-03-19 LAB — HIV ANTIBODY (ROUTINE TESTING W REFLEX): HIV: NONREACTIVE

## 2013-03-19 LAB — CBC
MCH: 30.7 pg (ref 26.0–34.0)
MCHC: 33.1 g/dL (ref 30.0–36.0)
RDW: 14.3 % (ref 11.5–15.5)

## 2013-03-19 LAB — ANTIBODY SCREEN: Antibody Screen: NEGATIVE

## 2013-03-20 LAB — URINE CULTURE: Colony Count: >=100000

## 2013-04-01 ENCOUNTER — Encounter: Payer: Medicaid Other | Admitting: Obstetrics & Gynecology

## 2013-04-05 ENCOUNTER — Telehealth: Payer: Self-pay | Admitting: *Deleted

## 2013-04-05 NOTE — Telephone Encounter (Signed)
C/o random cervical pressure after being on her feet, + FM, no vaginal. Pt informed normal to have pressure especially after being on her feet for long periods of time. Pt informed of signs of labor, gush of fluids, to call office back for decrease FM, vaginal bleeding. Pt verbalized understanding. Call transferred to front staff to make routine prenatal appt.

## 2013-04-08 ENCOUNTER — Encounter (HOSPITAL_COMMUNITY): Payer: Self-pay | Admitting: *Deleted

## 2013-04-08 ENCOUNTER — Telehealth: Payer: Self-pay | Admitting: Obstetrics & Gynecology

## 2013-04-08 ENCOUNTER — Inpatient Hospital Stay (HOSPITAL_COMMUNITY)
Admission: AD | Admit: 2013-04-08 | Discharge: 2013-04-08 | Disposition: A | Discharge status: Home / Self Care | Payer: BC Managed Care – PPO | Source: Ambulatory Visit | Attending: Obstetrics and Gynecology | Admitting: Obstetrics and Gynecology

## 2013-04-08 DIAGNOSIS — N949 Unspecified condition associated with female genital organs and menstrual cycle: Secondary | ICD-10-CM | POA: Insufficient documentation

## 2013-04-08 DIAGNOSIS — O99891 Other specified diseases and conditions complicating pregnancy: Secondary | ICD-10-CM | POA: Insufficient documentation

## 2013-04-08 NOTE — Telephone Encounter (Signed)
Pt states was seen at [facility] due to gush of fluids. Pt states was told at [facility] dilated to 1 cm and at neg 2. 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 Informed pt per our protocol pt not to lift greater than 35 lbs. Pt has an appt on Monday and will discuss with provider at that time. Pt encouraged to bring job description. Pt verbalized understanding.

## 2013-04-08 NOTE — MAU Note (Signed)
Pt reports gush of fluid at 1930 and 2000, pressure in vaginal area.

## 2013-04-08 NOTE — MAU Provider Note (Signed)
History     CSN: [id number]  Arrival date and time: 04/08/13 0045   None     Chief Complaint  Patient presents with  . Rupture of Membranes   HPI [name] is a 27yo G3P1011 at 33.4wks who presents for eval of questionable leaking fluid- felt a couple of small gushes. Denies bldg and ctx. She receives [facility] and it has been essentially unremarkable.  OB History   Grav Para Term Preterm Abortions TAB SAB Ect Mult Living   3 1 1  1     1       Past Medical History  Diagnosis Date  . Smoker   . Anemia   . Other and unspecified ovarian cysts     Hx of   . History of cocaine abuse     opioid    Past Surgical History  Procedure Laterality Date  . Tonsillectomy    . Cesarean section    . Oophorectomy Right     mucinous cystadenoma  . Open reduction internal fixation (orif) distal radial fracture Right 09/17/2012    Procedure: OPEN REDUCTION INTERNAL FIXATION (ORIF) DISTAL RADIAL FRACTURE;  Surgeon: Budd Palmer, MD;  Location: [facility] OR;  Service: Orthopedics;  Laterality: Right;  . Orif ankle fracture Right 09/17/2012    Procedure: OPEN REDUCTION INTERNAL FIXATION (ORIF) ANKLE FRACTURE;  Surgeon: Budd Palmer, MD;  Location: [facility] OR;  Service: Orthopedics;  Laterality: Right;  . Orif zygomatic fracture Right 09/22/2012    Procedure: OPEN REDUCTION INTERNAL FIXATION (ORIF) ZYGOMATIC FRACTURE ORBITAL FLOOR EXPLORATION WITH FROST STITCH ;  Surgeon: Flo Shanks, MD;  Location: [facility] OR;  Service: ENT;  Laterality: Right;  Repair of lip laceration    Family History  Problem Relation Age of Onset  . Cancer Paternal Grandmother     breast  . Hypertension Other   . Diabetes Other   . Coronary artery disease Other   . Cancer Father     prostate and esophageal    History  Substance Use Topics  . Smoking status: Current Every Day Smoker -- 1.00 packs/day for 10 years    Types: Cigarettes  . Smokeless tobacco: Not on file  . Alcohol Use: No     Comment: occasional;  not now    Allergies: No Known Allergies  Prescriptions prior to admission  Medication Sig Dispense Refill  . acetaminophen (TYLENOL) 500 MG tablet Take 1,000 mg by mouth every 6 (six) hours as needed for pain.      . ferrous sulfate 325 (65 FE) MG tablet Take 1 tablet (325 mg total) by mouth daily with breakfast.  30 tablet  3  . Homeopathic Products (ARNICARE EX) Apply 1 application topically every evening.      Marland Kitchen HYDROcodone-acetaminophen (NORCO/VICODIN) 5-325 MG per tablet Take 1 or 2 po Q 6hrs for pain  10 tablet  0  . ondansetron (ZOFRAN) 8 MG tablet Take 1 tablet (8 mg total) by mouth every 8 (eight) hours as needed for nausea.  24 tablet  1  . Prenatal Vit-Fe Fumarate-FA (PRENATAL MULTIVITAMIN) TABS Take 1 tablet by mouth daily.  34 tablet  0  . promethazine (PHENERGAN) 25 MG tablet Take 1 tablet (25 mg total) by mouth every 8 (eight) hours as needed for nausea.  15 tablet  0  . [DISCONTINUED] nitrofurantoin, macrocrystal-monohydrate, (MACROBID) 100 MG capsule Take 1 capsule (100 mg total) by mouth 2 (two) times daily.  14 capsule  0    ROS  Physical Exam   Blood pressure 109/62, pulse 100, temperature 98 F (36.7 C), temperature source Oral, resp. rate 18, height 5\' 7"  (1.702 m), weight 74.844 kg (165 lb), last menstrual period 08/19/2012, SpO2 100.00%.  Physical Exam  Constitutional: She is oriented to person, place, and time. She appears well-developed.  HENT:  Head: Normocephalic.  Cardiovascular: Normal rate.   Respiratory: Effort normal.  Genitourinary:  SSE: sm milky white vag d/c, neg pool, neg fern, cx 1/50/-2 Toco: occ irritability present  Musculoskeletal: Normal range of motion.  Neurological: She is alert and oriented to person, place, and time.  Skin: Skin is warm and dry.  Psychiatric: She has a normal mood and affect. Her behavior is normal. Thought content normal.    MAU Course  Procedures    Assessment and Plan  IUP at 33.4wks Vaginal  discharge  D/C home with preterm labor/ROM precautions F/U at [facility] as scheduled  Weylin Plagge, [facility] 04/08/2013, 1:48 AM

## 2013-04-08 NOTE — MAU Provider Note (Signed)
Attestation of Attending Supervision of Advanced Practitioner: Evaluation and management procedures were performed by the PA/NP/CNM/OB Fellow under my supervision/collaboration. Chart reviewed and agree with management and plan.  Overton Boggus V 04/08/2013 11:27 PM

## 2013-04-12 ENCOUNTER — Ambulatory Visit (INDEPENDENT_AMBULATORY_CARE_PROVIDER_SITE_OTHER): Payer: Medicaid Other | Admitting: Obstetrics & Gynecology

## 2013-04-12 ENCOUNTER — Encounter: Payer: Self-pay | Admitting: Obstetrics & Gynecology

## 2013-04-12 VITALS — BP 96/58 | Wt 163.0 lb

## 2013-04-12 DIAGNOSIS — O99019 Anemia complicating pregnancy, unspecified trimester: Secondary | ICD-10-CM

## 2013-04-12 DIAGNOSIS — Z029 Encounter for administrative examinations, unspecified: Secondary | ICD-10-CM

## 2013-04-12 DIAGNOSIS — O09299 Supervision of pregnancy with other poor reproductive or obstetric history, unspecified trimester: Secondary | ICD-10-CM

## 2013-04-12 DIAGNOSIS — Z1389 Encounter for screening for other disorder: Secondary | ICD-10-CM

## 2013-04-12 DIAGNOSIS — Z331 Pregnant state, incidental: Secondary | ICD-10-CM

## 2013-04-12 LAB — POCT URINALYSIS DIPSTICK
Glucose, UA: NEGATIVE
Nitrite, UA: NEGATIVE

## 2013-04-12 NOTE — Progress Notes (Signed)
BP weight and urine results all reviewed and noted. Patient reports good fetal movement, denies any bleeding and no rupture of membranes symptoms or regular contractions. Patient is without complaints. All questions were answered.  

## 2013-04-26 ENCOUNTER — Encounter: Payer: Self-pay | Admitting: Women's Health

## 2013-04-26 ENCOUNTER — Ambulatory Visit (INDEPENDENT_AMBULATORY_CARE_PROVIDER_SITE_OTHER): Payer: Medicaid Other | Admitting: Women's Health

## 2013-04-26 VITALS — BP 102/60 | Wt 163.5 lb

## 2013-04-26 DIAGNOSIS — Z3483 Encounter for supervision of other normal pregnancy, third trimester: Secondary | ICD-10-CM

## 2013-04-26 DIAGNOSIS — O2343 Unspecified infection of urinary tract in pregnancy, third trimester: Secondary | ICD-10-CM

## 2013-04-26 DIAGNOSIS — Z1389 Encounter for screening for other disorder: Secondary | ICD-10-CM

## 2013-04-26 DIAGNOSIS — O2390 Unspecified genitourinary tract infection in pregnancy, unspecified trimester: Secondary | ICD-10-CM

## 2013-04-26 DIAGNOSIS — R350 Frequency of micturition: Secondary | ICD-10-CM

## 2013-04-26 DIAGNOSIS — O99013 Anemia complicating pregnancy, third trimester: Secondary | ICD-10-CM

## 2013-04-26 DIAGNOSIS — Z331 Pregnant state, incidental: Secondary | ICD-10-CM

## 2013-04-26 DIAGNOSIS — O99019 Anemia complicating pregnancy, unspecified trimester: Secondary | ICD-10-CM

## 2013-04-26 DIAGNOSIS — O219 Vomiting of pregnancy, unspecified: Secondary | ICD-10-CM

## 2013-04-26 DIAGNOSIS — Z3403 Encounter for supervision of normal first pregnancy, third trimester: Secondary | ICD-10-CM

## 2013-04-26 DIAGNOSIS — Z98891 History of uterine scar from previous surgery: Secondary | ICD-10-CM | POA: Insufficient documentation

## 2013-04-26 DIAGNOSIS — O09299 Supervision of pregnancy with other poor reproductive or obstetric history, unspecified trimester: Secondary | ICD-10-CM

## 2013-04-26 LAB — POCT URINALYSIS DIPSTICK
Glucose, UA: NEGATIVE
Nitrite, UA: POSITIVE

## 2013-04-26 MED ORDER — ONDANSETRON HCL 8 MG PO TABS: 8.0000 mg | ORAL_TABLET | Freq: Three times a day (TID) | ORAL | Status: DC | PRN

## 2013-04-26 MED ORDER — CEPHALEXIN 500 MG PO CAPS: 500.0000 mg | ORAL_CAPSULE | Freq: Four times a day (QID) | ORAL | Status: DC

## 2013-04-26 MED ORDER — FERROUS SULFATE 325 (65 FE) MG PO TABS: 325.0000 mg | ORAL_TABLET | Freq: Every day | ORAL | Status: DC

## 2013-04-26 NOTE — Addendum Note (Signed)
Addended by: Colen Darling on: 04/26/2013 03:10 PM   Modules accepted: Orders

## 2013-04-26 NOTE — Progress Notes (Signed)
Reports good fm. Denies uc's, lof, vb, urinary hesitancy, or dysuria.  Pos urinary frequency and urgency. Denies fever/chills/flank pain. Frustrated b/c she's been asking whole pregnancy to see JVF and hasn't yet. H/O prev c/s, offered VBAC, but desires RLTCS. Discussed contraception, had been thinking about BTL, but would rather do mirena vs. Nexplanon. Rx Keflex for UTI, urine culture sent. Refilled Fe and zofran per request. Reviewed ptl s/s, fkc.  All questions answered. F/U in 1wk for visit w/ JVF.

## 2013-04-28 LAB — URINE CULTURE: Colony Count: >=100000

## 2013-05-01 ENCOUNTER — Encounter: Payer: Self-pay | Admitting: Women's Health

## 2013-05-01 DIAGNOSIS — O2343 Unspecified infection of urinary tract in pregnancy, third trimester: Secondary | ICD-10-CM | POA: Insufficient documentation

## 2013-05-03 ENCOUNTER — Ambulatory Visit (INDEPENDENT_AMBULATORY_CARE_PROVIDER_SITE_OTHER): Payer: BC Managed Care – PPO | Admitting: Obstetrics and Gynecology

## 2013-05-03 VITALS — BP 98/54 | Wt 165.6 lb

## 2013-05-03 DIAGNOSIS — O09299 Supervision of pregnancy with other poor reproductive or obstetric history, unspecified trimester: Secondary | ICD-10-CM

## 2013-05-03 DIAGNOSIS — Z331 Pregnant state, incidental: Secondary | ICD-10-CM

## 2013-05-03 DIAGNOSIS — Z3493 Encounter for supervision of normal pregnancy, unspecified, third trimester: Secondary | ICD-10-CM

## 2013-05-03 DIAGNOSIS — Z1389 Encounter for screening for other disorder: Secondary | ICD-10-CM

## 2013-05-03 DIAGNOSIS — O99891 Other specified diseases and conditions complicating pregnancy: Secondary | ICD-10-CM

## 2013-05-03 DIAGNOSIS — O99019 Anemia complicating pregnancy, unspecified trimester: Secondary | ICD-10-CM

## 2013-05-03 MED ORDER — HYDROCODONE-ACETAMINOPHEN 5-325 MG PO TABS: ORAL_TABLET | ORAL | Status: DC

## 2013-05-03 NOTE — Progress Notes (Signed)
Out of work x 2 1/2 weeks due to abd discomforts of pregnancy and ruq pain gallbladder, has requested preg disability short term. Not a candidate for FMLA due to employment less than a year. Pt declining TOLAC still .  Pt need cesarean scheduled. For 39 wk Pex: cx posterior, 1 cm unchanged long vtx low -1-2station.

## 2013-05-03 NOTE — Progress Notes (Signed)
Pt unable to obtain urine sample today. Pt states "thinks she lost mucus plug and gallbladder pain" Pt states having irregular contractions through out the night. C/o hip pain.

## 2013-05-03 NOTE — Patient Instructions (Signed)
Surgery will be scheduled for 39 weeks, either Monday or Tuesday the 27 th or 28th. Followup 1 wk.

## 2013-05-03 NOTE — Progress Notes (Signed)
Repeat cesarean scheduled for 2:30 pm on 17 May 2013 PreOp appt scheduled for  1 PM on 24 oct 1 pm

## 2013-05-07 ENCOUNTER — Encounter (HOSPITAL_COMMUNITY): Payer: Self-pay | Admitting: Pharmacist

## 2013-05-10 ENCOUNTER — Encounter: Payer: Self-pay | Admitting: Women's Health

## 2013-05-10 ENCOUNTER — Ambulatory Visit (INDEPENDENT_AMBULATORY_CARE_PROVIDER_SITE_OTHER): Payer: BC Managed Care – PPO | Admitting: Women's Health

## 2013-05-10 ENCOUNTER — Encounter (INDEPENDENT_AMBULATORY_CARE_PROVIDER_SITE_OTHER): Payer: Self-pay

## 2013-05-10 VITALS — BP 100/62 | Wt 167.0 lb

## 2013-05-10 DIAGNOSIS — O99891 Other specified diseases and conditions complicating pregnancy: Secondary | ICD-10-CM

## 2013-05-10 DIAGNOSIS — O99019 Anemia complicating pregnancy, unspecified trimester: Secondary | ICD-10-CM

## 2013-05-10 DIAGNOSIS — Z331 Pregnant state, incidental: Secondary | ICD-10-CM

## 2013-05-10 DIAGNOSIS — Z1389 Encounter for screening for other disorder: Secondary | ICD-10-CM

## 2013-05-10 DIAGNOSIS — Z3483 Encounter for supervision of other normal pregnancy, third trimester: Secondary | ICD-10-CM

## 2013-05-10 DIAGNOSIS — O09299 Supervision of pregnancy with other poor reproductive or obstetric history, unspecified trimester: Secondary | ICD-10-CM

## 2013-05-10 LAB — POCT URINALYSIS DIPSTICK
Blood, UA: NEGATIVE
Glucose, UA: NEGATIVE
Ketones, UA: NEGATIVE

## 2013-05-10 NOTE — Progress Notes (Signed)
Reports good fm. Denies uc's, lof, vb, urinary frequency, urgency, hesitancy, or dysuria.  No complaints.  Reviewed labor s/s, fkc.  RLTCS scheduled for 05/17/13. All questions answered.

## 2013-05-10 NOTE — Patient Instructions (Signed)
Pre-op appointment 05/14/13 @ 1pm C/S 05/17/13 @ 2:30pm. Pre-op nurse will tell you what time you need to arrive [facility] Contractions Pregnancy is commonly associated with contractions of the uterus throughout the pregnancy. Towards the end of pregnancy (32 to 34 weeks), these contractions [facility] Willa Rough) can develop more often and may become more forceful. This is not true labor because these contractions do not result in opening (dilatation) and thinning of the cervix. They are sometimes difficult to tell apart from true labor because these contractions can be forceful and people have different pain tolerances. You should not feel embarrassed if you go to the hospital with false labor. Sometimes, the only way to tell if you are in true labor is for your caregiver to follow the changes in the cervix. How to tell the difference between true and false labor:  False labor.  The contractions of false labor are usually shorter, irregular and not as hard as those of true labor.  They are often felt in the front of the lower abdomen and in the groin.  They may leave with walking around or changing positions while lying down.  They get weaker and are shorter lasting as time goes on.  These contractions are usually irregular.  They do not usually become progressively stronger, regular and closer together as with true labor.  True labor.  Contractions in true labor last 30 to 70 seconds, become very regular, usually become more intense, and increase in frequency.  They do not go away with walking.  The discomfort is usually felt in the top of the uterus and spreads to the lower abdomen and low back.  True labor can be determined by your caregiver with an exam. This will show that the cervix is dilating and getting thinner. If there are no prenatal problems or other health problems associated with the pregnancy, it is completely safe to be sent home with false labor and await the onset of  true labor. HOME CARE INSTRUCTIONS   Keep up with your usual exercises and instructions.  Take medications as directed.  Keep your regular prenatal appointment.  Eat and drink lightly if you think you are going into labor.  If BH contractions are making you uncomfortable:  Change your activity position from lying down or resting to walking/walking to resting.  Sit and rest in a tub of warm water.  Drink 2 to 3 glasses of water. Dehydration may cause B-H contractions.  Do slow and deep breathing several times an hour. SEEK IMMEDIATE MEDICAL CARE IF:   Your contractions continue to become stronger, more regular, and closer together.  You have a gushing, burst or leaking of fluid from the vagina.  An oral temperature above 102 F (38.9 C) develops.  You have passage of blood-tinged mucus.  You develop vaginal bleeding.  You develop continuous belly (abdominal) pain.  You have low back pain that you never had before.  You feel the baby's head pushing down causing pelvic pressure.  The baby is not moving as much as it used to. Document Released: 07/08/2005 Document Revised: 09/30/2011 Document Reviewed: 12/30/2008 [facility] Patient Information 2014 [place], [place].

## 2013-05-14 ENCOUNTER — Encounter (HOSPITAL_COMMUNITY)
Admission: RE | Admit: 2013-05-14 | Discharge: 2013-05-14 | Disposition: A | Discharge status: Home / Self Care | Payer: Medicaid Other | Source: Ambulatory Visit | Attending: Obstetrics and Gynecology | Admitting: Obstetrics and Gynecology

## 2013-05-14 ENCOUNTER — Encounter (HOSPITAL_COMMUNITY): Payer: Self-pay

## 2013-05-14 DIAGNOSIS — Z01818 Encounter for other preprocedural examination: Secondary | ICD-10-CM | POA: Insufficient documentation

## 2013-05-14 DIAGNOSIS — Z01812 Encounter for preprocedural laboratory examination: Secondary | ICD-10-CM | POA: Insufficient documentation

## 2013-05-14 LAB — CBC
Hemoglobin: 10.5 g/dL — ABNORMAL LOW (ref 12.0–15.0)
MCH: 31.3 pg (ref 26.0–34.0)
MCV: 89.6 fL (ref 78.0–100.0)
RBC: 3.35 MIL/uL — ABNORMAL LOW (ref 3.87–5.11)

## 2013-05-14 LAB — RPR: RPR Ser Ql: NONREACTIVE

## 2013-05-14 LAB — TYPE AND SCREEN: ABO/RH(D): A POS

## 2013-05-14 LAB — ABO/RH: ABO/RH(D): A POS

## 2013-05-14 NOTE — Patient Instructions (Signed)
20 JANAY CANAN  05/14/2013   Your procedure is scheduled on:  05/17/13  Enter through the Main Entrance of [facility] at 1 PM AM.  Pick up the phone at the desk and dial [phone].   Call this number if you have problems the morning of surgery: [phone]   Remember:   Do not eat food:After Midnight.  Do not drink clear liquids: 4 Hours before arrival.  Take these medicines the morning of surgery with A SIP OF WATER: NA   Do not wear jewelry, make-up or nail polish.  Do not wear lotions, powders, or perfumes. You may wear deodorant.  Do not shave 48 hours prior to surgery.  Do not bring valuables to the hospital.  [facility] is not   responsible for any belongings or valuables brought to the hospital.  Contacts, dentures or bridgework may not be worn into surgery.  Leave suitcase in the car. After surgery it may be brought to your room.  For patients admitted to the hospital, checkout time is 11:00 AM the day of              discharge.   Patients discharged the day of surgery will not be allowed to drive             home.  Name and phone number of your driver: NA  Special Instructions:   Shower using CHG 2 nights before surgery and the night before surgery.  If you shower the day of surgery use CHG.  Use special wash - you have one bottle of CHG for all showers.  You should use approximately 1/3 of the bottle for each shower.   Please read over the following fact sheets that you were given:   Surgical Site Infection Prevention

## 2013-05-17 ENCOUNTER — Encounter (HOSPITAL_COMMUNITY): Payer: Self-pay | Admitting: *Deleted

## 2013-05-17 ENCOUNTER — Encounter (HOSPITAL_COMMUNITY): Payer: Medicaid Other | Admitting: Anesthesiology

## 2013-05-17 ENCOUNTER — Inpatient Hospital Stay (HOSPITAL_COMMUNITY): Payer: Medicaid Other | Admitting: Anesthesiology

## 2013-05-17 ENCOUNTER — Inpatient Hospital Stay (HOSPITAL_COMMUNITY)
Admission: RE | Admit: 2013-05-17 | Discharge: 2013-05-19 | DRG: 766 | Disposition: A | Discharge status: Home / Self Care | Payer: Medicaid Other | Source: Ambulatory Visit | Attending: Obstetrics and Gynecology | Admitting: Obstetrics and Gynecology

## 2013-05-17 ENCOUNTER — Encounter (HOSPITAL_COMMUNITY)
Admission: RE | Disposition: A | Discharge status: Home / Self Care | Payer: Self-pay | Source: Ambulatory Visit | Attending: Obstetrics and Gynecology

## 2013-05-17 DIAGNOSIS — O99892 Other specified diseases and conditions complicating childbirth: Secondary | ICD-10-CM | POA: Diagnosis present

## 2013-05-17 DIAGNOSIS — O34219 Maternal care for unspecified type scar from previous cesarean delivery: Principal | ICD-10-CM | POA: Diagnosis present

## 2013-05-17 DIAGNOSIS — O9989 Other specified diseases and conditions complicating pregnancy, childbirth and the puerperium: Secondary | ICD-10-CM

## 2013-05-17 DIAGNOSIS — O2343 Unspecified infection of urinary tract in pregnancy, third trimester: Secondary | ICD-10-CM

## 2013-05-17 DIAGNOSIS — Z98891 History of uterine scar from previous surgery: Secondary | ICD-10-CM

## 2013-05-17 DIAGNOSIS — Z22330 Carrier of Group B streptococcus: Secondary | ICD-10-CM

## 2013-05-17 SURGERY — Surgical Case
Anesthesia: Spinal | Site: Abdomen | Wound class: Clean Contaminated

## 2013-05-17 MED ORDER — LACTATED RINGERS IV SOLN: Freq: Once | INTRAVENOUS | Status: DC

## 2013-05-17 MED ORDER — DIPHENHYDRAMINE HCL 25 MG PO CAPS: 25.0000 mg | ORAL_CAPSULE | Freq: Four times a day (QID) | ORAL | Status: DC | PRN

## 2013-05-17 MED ORDER — SIMETHICONE 80 MG PO CHEW
80.0000 mg | CHEWABLE_TABLET | ORAL | Status: DC
  Administered 2013-05-17 – 2013-05-19 (×2): 80 mg via ORAL
  Filled 2013-05-17 (×2): qty 1

## 2013-05-17 MED ORDER — KETOROLAC TROMETHAMINE 30 MG/ML IJ SOLN
30.0000 mg | Freq: Four times a day (QID) | INTRAMUSCULAR | Status: AC | PRN
  Administered 2013-05-17: 30 mg via INTRAVENOUS
  Filled 2013-05-17: qty 1

## 2013-05-17 MED ORDER — NALBUPHINE HCL 10 MG/ML IJ SOLN
5.0000 mg | INTRAMUSCULAR | Status: DC | PRN
  Filled 2013-05-17 (×2): qty 1

## 2013-05-17 MED ORDER — IBUPROFEN 600 MG PO TABS
600.0000 mg | ORAL_TABLET | Freq: Four times a day (QID) | ORAL | Status: DC
  Administered 2013-05-18 – 2013-05-19 (×6): 600 mg via ORAL
  Filled 2013-05-17 (×6): qty 1

## 2013-05-17 MED ORDER — FENTANYL CITRATE 0.05 MG/ML IJ SOLN
INTRAMUSCULAR | Status: DC | PRN
  Administered 2013-05-17: 25 ug via INTRATHECAL

## 2013-05-17 MED ORDER — CEFAZOLIN SODIUM-DEXTROSE 2-3 GM-% IV SOLR
2.0000 g | Freq: Once | INTRAVENOUS | Status: AC
  Administered 2013-05-17: 2 g via INTRAVENOUS
  Filled 2013-05-17: qty 50

## 2013-05-17 MED ORDER — MORPHINE SULFATE (PF) 0.5 MG/ML IJ SOLN
INTRAMUSCULAR | Status: DC | PRN
  Administered 2013-05-17: .15 mg via INTRATHECAL

## 2013-05-17 MED ORDER — SIMETHICONE 80 MG PO CHEW
80.0000 mg | CHEWABLE_TABLET | ORAL | Status: DC | PRN
  Filled 2013-05-17: qty 1

## 2013-05-17 MED ORDER — FENTANYL CITRATE 0.05 MG/ML IJ SOLN
INTRAMUSCULAR | Status: AC
  Filled 2013-05-17: qty 2

## 2013-05-17 MED ORDER — OXYCODONE-ACETAMINOPHEN 5-325 MG PO TABS
1.0000 | ORAL_TABLET | ORAL | Status: DC | PRN
  Administered 2013-05-18: 1 via ORAL
  Administered 2013-05-18 – 2013-05-19 (×7): 2 via ORAL
  Filled 2013-05-17 (×5): qty 2
  Filled 2013-05-17: qty 1
  Filled 2013-05-17 (×2): qty 2

## 2013-05-17 MED ORDER — OXYTOCIN 40 UNITS IN LACTATED RINGERS INFUSION - SIMPLE MED: 62.5000 mL/h | INTRAVENOUS | Status: AC

## 2013-05-17 MED ORDER — SODIUM CHLORIDE 0.9 % IJ SOLN: 3.0000 mL | INTRAMUSCULAR | Status: DC | PRN

## 2013-05-17 MED ORDER — KETOROLAC TROMETHAMINE 30 MG/ML IJ SOLN
INTRAMUSCULAR | Status: AC
  Filled 2013-05-17: qty 1

## 2013-05-17 MED ORDER — BUPIVACAINE IN DEXTROSE 0.75-8.25 % IT SOLN
INTRATHECAL | Status: DC | PRN
  Administered 2013-05-17: 1.8 mL via INTRATHECAL

## 2013-05-17 MED ORDER — SENNOSIDES-DOCUSATE SODIUM 8.6-50 MG PO TABS
2.0000 | ORAL_TABLET | ORAL | Status: DC
  Administered 2013-05-17 – 2013-05-19 (×2): 2 via ORAL
  Filled 2013-05-17 (×2): qty 2

## 2013-05-17 MED ORDER — DIPHENHYDRAMINE HCL 25 MG PO CAPS
25.0000 mg | ORAL_CAPSULE | ORAL | Status: DC | PRN
  Filled 2013-05-17: qty 1

## 2013-05-17 MED ORDER — LANOLIN HYDROUS EX OINT: 1.0000 "application " | TOPICAL_OINTMENT | CUTANEOUS | Status: DC | PRN

## 2013-05-17 MED ORDER — WITCH HAZEL-GLYCERIN EX PADS: 1.0000 "application " | MEDICATED_PAD | CUTANEOUS | Status: DC | PRN

## 2013-05-17 MED ORDER — GLYCOPYRROLATE 0.2 MG/ML IJ SOLN
INTRAMUSCULAR | Status: DC | PRN
  Administered 2013-05-17: .2 mg via INTRAVENOUS

## 2013-05-17 MED ORDER — ONDANSETRON HCL 4 MG/2ML IJ SOLN
INTRAMUSCULAR | Status: AC
  Filled 2013-05-17: qty 2

## 2013-05-17 MED ORDER — NALOXONE HCL 0.4 MG/ML IJ SOLN: 0.4000 mg | INTRAMUSCULAR | Status: DC | PRN

## 2013-05-17 MED ORDER — PHENYLEPHRINE 8 MG IN D5W 100 ML (0.08MG/ML) PREMIX OPTIME
INJECTION | INTRAVENOUS | Status: DC | PRN
  Administered 2013-05-17: 40 ug/min via INTRAVENOUS

## 2013-05-17 MED ORDER — NALBUPHINE HCL 10 MG/ML IJ SOLN
5.0000 mg | INTRAMUSCULAR | Status: DC | PRN
  Administered 2013-05-17: 10 mg via INTRAVENOUS

## 2013-05-17 MED ORDER — ONDANSETRON HCL 4 MG/2ML IJ SOLN: 4.0000 mg | Freq: Three times a day (TID) | INTRAMUSCULAR | Status: DC | PRN

## 2013-05-17 MED ORDER — DIPHENHYDRAMINE HCL 50 MG/ML IJ SOLN: 12.5000 mg | INTRAMUSCULAR | Status: DC | PRN

## 2013-05-17 MED ORDER — SCOPOLAMINE 1 MG/3DAYS TD PT72
1.0000 | MEDICATED_PATCH | Freq: Once | TRANSDERMAL | Status: DC
  Filled 2013-05-17: qty 1

## 2013-05-17 MED ORDER — OXYTOCIN 10 UNIT/ML IJ SOLN
40.0000 [IU] | INTRAVENOUS | Status: DC | PRN
  Administered 2013-05-17: 40 [IU] via INTRAVENOUS

## 2013-05-17 MED ORDER — CEFAZOLIN SODIUM-DEXTROSE 2-3 GM-% IV SOLR
INTRAVENOUS | Status: AC
  Filled 2013-05-17: qty 50

## 2013-05-17 MED ORDER — SIMETHICONE 80 MG PO CHEW
80.0000 mg | CHEWABLE_TABLET | Freq: Three times a day (TID) | ORAL | Status: DC
  Administered 2013-05-18 – 2013-05-19 (×4): 80 mg via ORAL
  Filled 2013-05-17 (×4): qty 1

## 2013-05-17 MED ORDER — MENTHOL 3 MG MT LOZG: 1.0000 | LOZENGE | OROMUCOSAL | Status: DC | PRN

## 2013-05-17 MED ORDER — LACTATED RINGERS IV SOLN
INTRAVENOUS | Status: DC
  Administered 2013-05-18: 04:00:00 via INTRAVENOUS

## 2013-05-17 MED ORDER — ONDANSETRON HCL 4 MG PO TABS: 4.0000 mg | ORAL_TABLET | ORAL | Status: DC | PRN

## 2013-05-17 MED ORDER — OXYTOCIN 10 UNIT/ML IJ SOLN
INTRAMUSCULAR | Status: AC
  Filled 2013-05-17: qty 4

## 2013-05-17 MED ORDER — MORPHINE SULFATE 0.5 MG/ML IJ SOLN
INTRAMUSCULAR | Status: AC
  Filled 2013-05-17: qty 10

## 2013-05-17 MED ORDER — DIBUCAINE 1 % RE OINT: 1.0000 "application " | TOPICAL_OINTMENT | RECTAL | Status: DC | PRN

## 2013-05-17 MED ORDER — MEPERIDINE HCL 25 MG/ML IJ SOLN: 6.2500 mg | INTRAMUSCULAR | Status: DC | PRN

## 2013-05-17 MED ORDER — PRENATAL MULTIVITAMIN CH
1.0000 | ORAL_TABLET | Freq: Every day | ORAL | Status: DC
  Administered 2013-05-18 – 2013-05-19 (×2): 1 via ORAL
  Filled 2013-05-17 (×3): qty 1

## 2013-05-17 MED ORDER — METOCLOPRAMIDE HCL 5 MG/ML IJ SOLN: 10.0000 mg | Freq: Three times a day (TID) | INTRAMUSCULAR | Status: DC | PRN

## 2013-05-17 MED ORDER — FENTANYL CITRATE 0.05 MG/ML IJ SOLN: 25.0000 ug | INTRAMUSCULAR | Status: DC | PRN

## 2013-05-17 MED ORDER — LACTATED RINGERS IV SOLN
INTRAVENOUS | Status: DC | PRN
  Administered 2013-05-17 (×2): via INTRAVENOUS

## 2013-05-17 MED ORDER — ONDANSETRON HCL 4 MG/2ML IJ SOLN: 4.0000 mg | INTRAMUSCULAR | Status: DC | PRN

## 2013-05-17 MED ORDER — LACTATED RINGERS IV SOLN
INTRAVENOUS | Status: DC | PRN
  Administered 2013-05-17: 15:00:00 via INTRAVENOUS

## 2013-05-17 MED ORDER — ONDANSETRON HCL 4 MG/2ML IJ SOLN
INTRAMUSCULAR | Status: DC | PRN
  Administered 2013-05-17: 4 mg via INTRAVENOUS

## 2013-05-17 MED ORDER — DIPHENHYDRAMINE HCL 50 MG/ML IJ SOLN: 25.0000 mg | INTRAMUSCULAR | Status: DC | PRN

## 2013-05-17 MED ORDER — SCOPOLAMINE 1 MG/3DAYS TD PT72: 1.0000 | MEDICATED_PATCH | Freq: Once | TRANSDERMAL | Status: DC

## 2013-05-17 MED ORDER — ZOLPIDEM TARTRATE 5 MG PO TABS: 5.0000 mg | ORAL_TABLET | Freq: Every evening | ORAL | Status: DC | PRN

## 2013-05-17 MED ORDER — TETANUS-DIPHTH-ACELL PERTUSSIS 5-2.5-18.5 LF-MCG/0.5 IM SUSP: 0.5000 mL | Freq: Once | INTRAMUSCULAR | Status: DC

## 2013-05-17 MED ORDER — NALOXONE HCL 1 MG/ML IJ SOLN
1.0000 ug/kg/h | INTRAVENOUS | Status: DC | PRN
  Filled 2013-05-17: qty 2

## 2013-05-17 MED ORDER — KETOROLAC TROMETHAMINE 30 MG/ML IJ SOLN
30.0000 mg | Freq: Four times a day (QID) | INTRAMUSCULAR | Status: AC | PRN
  Administered 2013-05-17: 30 mg via INTRAMUSCULAR

## 2013-05-17 SURGICAL SUPPLY — 33 items
BENZOIN TINCTURE PRP APPL 2/3 (GAUZE/BANDAGES/DRESSINGS) IMPLANT
CLAMP CORD UMBIL (MISCELLANEOUS) IMPLANT
CLOTH BEACON ORANGE TIMEOUT ST (SAFETY) ×2 IMPLANT
DRAPE LG THREE QUARTER DISP (DRAPES) ×4 IMPLANT
DRSG OPSITE POSTOP 4X10 (GAUZE/BANDAGES/DRESSINGS) ×2 IMPLANT
DURAPREP 26ML APPLICATOR (WOUND CARE) ×2 IMPLANT
ELECT REM PT RETURN 9FT ADLT (ELECTROSURGICAL) ×2
ELECTRODE REM PT RTRN 9FT ADLT (ELECTROSURGICAL) ×1 IMPLANT
EXTRACTOR VACUUM KIWI (MISCELLANEOUS) IMPLANT
GLOVE BIO SURGEON ST LM GN SZ9 (GLOVE) ×2 IMPLANT
GLOVE BIOGEL PI IND STRL 9 (GLOVE) ×1 IMPLANT
GLOVE BIOGEL PI INDICATOR 9 (GLOVE) ×1
GOWN PREVENTION PLUS XLARGE (GOWN DISPOSABLE) ×2 IMPLANT
GOWN STRL REIN 3XL LVL4 (GOWN DISPOSABLE) ×2 IMPLANT
GOWN STRL REIN XL XLG (GOWN DISPOSABLE) IMPLANT
NEEDLE HYPO 25X5/8 SAFETYGLIDE (NEEDLE) IMPLANT
NS IRRIG 1000ML POUR BTL (IV SOLUTION) ×2 IMPLANT
PACK C SECTION WH (CUSTOM PROCEDURE TRAY) ×2 IMPLANT
PAD OB MATERNITY 4.3X12.25 (PERSONAL CARE ITEMS) ×2 IMPLANT
RETRACTOR WND ALEXIS 25 LRG (MISCELLANEOUS) IMPLANT
RTRCTR C-SECT PINK 25CM LRG (MISCELLANEOUS) IMPLANT
RTRCTR WOUND ALEXIS 25CM LRG (MISCELLANEOUS)
STRIP CLOSURE SKIN 1/2X4 (GAUZE/BANDAGES/DRESSINGS) IMPLANT
SUT CHROMIC 0 CTX 36 (SUTURE) ×4 IMPLANT
SUT VIC AB 0 CT1 27 (SUTURE) ×1
SUT VIC AB 0 CT1 27XBRD ANBCTR (SUTURE) ×1 IMPLANT
SUT VIC AB 2-0 CT1 27 (SUTURE) ×2
SUT VIC AB 2-0 CT1 TAPERPNT 27 (SUTURE) ×2 IMPLANT
SUT VIC AB 4-0 KS 27 (SUTURE) ×2 IMPLANT
SYR BULB IRRIGATION 50ML (SYRINGE) IMPLANT
TOWEL OR 17X24 6PK STRL BLUE (TOWEL DISPOSABLE) ×2 IMPLANT
TRAY FOLEY CATH 14FR (SET/KITS/TRAYS/PACK) ×2 IMPLANT
WATER STERILE IRR 1000ML POUR (IV SOLUTION) ×2 IMPLANT

## 2013-05-17 NOTE — Anesthesia Postprocedure Evaluation (Signed)
  Anesthesia Post-op Note  Patient: [name]  Procedure(s) Performed: Procedure(s): REPEAT CESAREAN SECTION (N/A)  Patient Location: PACU  Anesthesia Type:Spinal  Level of Consciousness: awake, alert  and oriented  Airway and Oxygen Therapy: Patient Spontanous Breathing  Post-op Pain: none  Post-op Assessment: Post-op Vital signs reviewed, Patient's Cardiovascular Status Stable, Respiratory Function Stable, Patent Airway, No signs of Nausea or vomiting, Pain level controlled, No headache and No backache  Post-op Vital Signs: Reviewed and stable  Complications: No apparent anesthesia complications

## 2013-05-17 NOTE — H&P (Signed)
[name] is a 27 y.o. female presenting for  Repeat cesarean section. She is a 27 year old gravida 3 para 1011 prior cesarean section x1 who declines trial of labor she is 39 weeks 1 day with scheduled cesarean section at this time. Prenatal course is been [facility] with pediatrics coverage by Dr. Lubertha South She had a normal quad screen is a female infant [name] normal glucose tolerance test group B strep positive bottle feeding contraception plans are Mirena or Nexplanon   History OB History   Grav Para Term Preterm Abortions TAB SAB Ect Mult Living   3 1 1  1     1      Past Medical History  Diagnosis Date  . Smoker   . Anemia   . Other and unspecified ovarian cysts     Hx of   . History of cocaine abuse     opioid   Past Surgical History  Procedure Laterality Date  . Tonsillectomy    . Cesarean section    . Oophorectomy Right     mucinous cystadenoma  . Open reduction internal fixation (orif) distal radial fracture Right 09/17/2012    Procedure: OPEN REDUCTION INTERNAL FIXATION (ORIF) DISTAL RADIAL FRACTURE;  Surgeon: Budd Palmer, MD;  Location: [facility] OR;  Service: Orthopedics;  Laterality: Right;  . Orif ankle fracture Right 09/17/2012    Procedure: OPEN REDUCTION INTERNAL FIXATION (ORIF) ANKLE FRACTURE;  Surgeon: Budd Palmer, MD;  Location: [facility] OR;  Service: Orthopedics;  Laterality: Right;  . Orif zygomatic fracture Right 09/22/2012    Procedure: OPEN REDUCTION INTERNAL FIXATION (ORIF) ZYGOMATIC FRACTURE ORBITAL FLOOR EXPLORATION WITH FROST STITCH ;  Surgeon: Flo Shanks, MD;  Location: [facility] OR;  Service: ENT;  Laterality: Right;  Repair of lip laceration   Family History: family history includes Cancer in her father and paternal grandmother; Coronary artery disease in her other; Diabetes in her other; Hypertension in her other. Social History:  reports that she has been smoking Cigarettes.  She has a 10 pack-year smoking history. She has  never used smokeless tobacco. She reports that she does not drink alcohol or use illicit drugs.   Prenatal Transfer Tool  Maternal Diabetes: No Genetic Screening: Normal Maternal Ultrasounds/Referrals: Normal Fetal Ultrasounds or other Referrals:  None Maternal Substance Abuse:  No Significant Maternal Medications:  Meds include: Other:  Significant Maternal Lab Results:  Lab values include: Group B Strep positive Other Comments:  None  ROS    Blood pressure 114/74, pulse 69, temperature 97.7 F (36.5 C), temperature source Oral, resp. rate 18, last menstrual period 08/19/2012, SpO2 100.00%. Exam Physical Exam  Constitutional: She appears well-developed and well-nourished.  HENT:  Head: Normocephalic and atraumatic.  Eyes: Pupils are equal, round, and reactive to light.  Neck: Neck supple.  Cardiovascular: Normal rate and regular rhythm.   Respiratory: Effort normal.  GI: Soft.  Gravid uterus consistent with dates prior Pfannenstiel incision to lower abdomen    Prenatal labs: ABO, Rh: --/--/A POS, A POS (10/24 1400) Antibody: NEG (10/24 1400) Rubella: 1.73 (05/22 1510) RPR: NON REACTIVE (10/24 1400)  HBsAg: NEGATIVE (05/22 1510)  HIV: NON REACTIVE (08/28 1025)  GBS: POSITIVE (10/06 1605)   Assessment/Plan: Pregnancy 39 weeks 1 day prior cesarean section declining trial of labor Scheduled for repeat cesarean section at 2:30 PM on 05/18/2003  Procedure risks potential complications such as bleeding infection or need for transfusion, injury to adjacent organs reviewed with patient   [name]  05/17/2013, 1:27 PM

## 2013-05-17 NOTE — Anesthesia Preprocedure Evaluation (Signed)
Anesthesia Evaluation  Patient identified by MRN, date of birth, ID band Patient awake    Reviewed: Allergy & Precautions, H&P , NPO status , Patient's Chart, lab work & pertinent test results  Airway Mallampati: II TM Distance: >3 FB Neck ROM: Full    Dental no notable dental hx. (+) Teeth Intact   Pulmonary Current Smoker,  breath sounds clear to auscultation  Pulmonary exam normal       Cardiovascular negative cardio ROS  Rhythm:Regular Rate:Normal     Neuro/Psych negative neurological ROS  negative psych ROS   GI/Hepatic GERD-  ,(+)     substance abuse  cocaine use, Hx/o polysubstance abuse   Endo/Other  negative endocrine ROS  Renal/GU negative Renal ROS  negative genitourinary   Musculoskeletal   Abdominal   Peds  Hematology  (+) Blood dyscrasia, anemia ,   Anesthesia Other Findings   Reproductive/Obstetrics Previous C/Section                           Anesthesia Physical Anesthesia Plan  ASA: II  Anesthesia Plan: Spinal   Post-op Pain Management:    Induction:   Airway Management Planned: Natural Airway  Additional Equipment:   Intra-op Plan:   Post-operative Plan:   Informed Consent: I have reviewed the patients History and Physical, chart, labs and discussed the procedure including the risks, benefits and alternatives for the proposed anesthesia with the patient or authorized representative who has indicated his/her understanding and acceptance.     Plan Discussed with: Anesthesiologist, CRNA and Surgeon  Anesthesia Plan Comments:         Anesthesia Quick Evaluation

## 2013-05-17 NOTE — Op Note (Signed)
05/17/2013  4:12 PM  PATIENT:  [name]  27 y.o. female  PRE-OPERATIVE DIAGNOSIS:  PREV C/S preg 39+1wk  POST-OPERATIVE DIAGNOSIS:  PREV C/S preg 39 + 1 wk    PROCEDURE:  Procedure(s): REPEAT CESAREAN SECTION (N/A)  SURGEON:  Surgeon(s) and Role:    * [name], MD - Primary  PHYSICIAN ASSISTANT: Odom, DO  Details of procedure: Patient was taken operating room spinal anesthesia introduced, and abdomen prepped and draped timeout conducted and Ancef administered. Transverse abdominal incision was made and the method of Pfannenstiel with excision of the old cicatrix. Fascia was opened transversely the peritoneal cavity opened easily the bladder flap was noted to be [place] the lower uterine segment and a bladder flap was developed and the bladder pushed inferiorly. All amount of pink light pink tinge was initially noted in the catheter later in the case, and the bladder was carefully inspected and no evidence of disruption. The Foley bulb could be brought up to the dome of the bladder and used visualize and no interruption of the bladder identifiable. The urine cleared.  Transverse uterine incision was made , and the fetal vertex identified and rotated into the incision and delivered with fundal pressure. Infant was a healthy female infant Apgars 9 and 9 place the pediatric details for further fashion. Cord was clamped cord blood sample obtained for routine get blood work, and placenta delivered in response to uterine massage. Her brain remnants on the inferior margin of the incision were extracted. There was a slight downward J-shaped extension of the incision which could be repaired with a figure-of-eight suture on the patient's right side with careful identification of uterine vessels and bladder and confirmed that they were out of the surgical area. Rest of the uterine incision was closed with single layer of running locking 0 chromic. Reapproximation of bladder flap was loosely  reapproximated with 3 interrupted sutures of 2-0 Vicryl. Abdomen was irrigated, anterior peritoneum closed with running 2-0 Vicryl, fascia closed with running 0 Vicryl, subcutaneous tissues approximated using interrupted horizontal mattress of 2-0 Vicryl, and subcuticular 4-0 Vicryl closure and skin incision completed the procedure sponge and needle counts were correct Steri-Strips were applied and patient to recovery room in stable condition

## 2013-05-17 NOTE — Brief Op Note (Signed)
05/17/2013  4:12 PM  PATIENT:  [name]  27 y.o. female  PRE-OPERATIVE DIAGNOSIS:  PREV C/S preg 39+1wk  POST-OPERATIVE DIAGNOSIS:  PREV C/S preg 39 + 1 wk    PROCEDURE:  Procedure(s): REPEAT CESAREAN SECTION (N/A)  SURGEON:  Surgeon(s) and Role:    * [name], MD - Primary  PHYSICIAN ASSISTANT: Odom, DO  ASSISTANTS:    ANESTHESIA:   spinal  EBL:  Total I/O In: 1900 [I.V.:1900] Out: 700 [Urine:100; Blood:600]  BLOOD ADMINISTERED:none  DRAINS: Urinary Catheter (Foley)   LOCAL MEDICATIONS USED:  NONE  SPECIMEN:  Source of Specimen:  placenta to L&D  DISPOSITION OF SPECIMEN:  PATHOLOGY  COUNTS:  YES  TOURNIQUET:  * No tourniquets in log *  DICTATION: .Dragon Dictation  PLAN OF CARE: Admit to inpatient   PATIENT DISPOSITION:  PACU - hemodynamically stable.   Delay start of Pharmacological VTE agent (>24hrs) due to surgical blood loss or risk of bleeding: not applicable

## 2013-05-17 NOTE — Transfer of Care (Signed)
Immediate Anesthesia Transfer of Care Note  Patient: [name]  Procedure(s) Performed: Procedure(s): REPEAT CESAREAN SECTION (N/A)  Patient Location: PACU  Anesthesia Type:Spinal  Level of Consciousness: awake  Airway & Oxygen Therapy: Patient Spontanous Breathing  Post-op Assessment: Report given to PACU RN  Post vital signs: Reviewed and stable  Complications: No apparent anesthesia complications

## 2013-05-17 NOTE — Consult Note (Signed)
Neonatology Note:   Attendance at C-section:    I was asked by Dr. Emelda Fear to attend this repeat C/S at term. The mother is a G3P1A1 A  pos, GBS positive with a history of polysubstance abuse (cocaine, opiates, UDS negative during pregnancy). She is a cigarette smoker. ROM at delivery, fluid clear. Infant vigorous with good spontaneous cry and tone. Needed only minimal bulb suctioning. Ap 9/9. Lungs clear to ausc in DR. To CN to care of Pediatrician.   Doretha Sou, MD

## 2013-05-17 NOTE — Anesthesia Procedure Notes (Signed)
Spinal  Patient location during procedure: OR Start time: 05/17/2013 2:56 PM Staffing Anesthesiologist: Aryaman Haliburton A. Performed by: anesthesiologist  Preanesthetic Checklist Completed: patient identified, site marked, surgical consent, pre-op evaluation, timeout performed, IV checked, risks and benefits discussed and monitors and equipment checked Spinal Block Patient position: sitting Prep: site prepped and draped and DuraPrep Patient monitoring: heart rate, cardiac monitor, continuous pulse ox and blood pressure Approach: midline Location: L3-4 Injection technique: single-shot Needle Needle type: Sprotte  Needle gauge: 24 G Needle length: 9 cm Needle insertion depth: 5 cm Assessment Sensory level: T4 Additional Notes Patient tolerated procedure well. Adequate sensory level.

## 2013-05-18 ENCOUNTER — Encounter (HOSPITAL_COMMUNITY): Payer: Self-pay | Admitting: Obstetrics and Gynecology

## 2013-05-18 LAB — CBC
HCT: 26.6 % — ABNORMAL LOW (ref 36.0–46.0)
Hemoglobin: 9.4 g/dL — ABNORMAL LOW (ref 12.0–15.0)
MCH: 31.6 pg (ref 26.0–34.0)
MCHC: 35.3 g/dL (ref 30.0–36.0)
MCV: 89.6 fL (ref 78.0–100.0)
RBC: 2.97 MIL/uL — ABNORMAL LOW (ref 3.87–5.11)
RDW: 13.9 % (ref 11.5–15.5)

## 2013-05-18 MED ORDER — TETANUS-DIPHTH-ACELL PERTUSSIS 5-2.5-18.5 LF-MCG/0.5 IM SUSP
0.5000 mL | Freq: Once | INTRAMUSCULAR | Status: AC
  Administered 2013-05-18: 0.5 mL via INTRAMUSCULAR
  Filled 2013-05-18: qty 0.5

## 2013-05-18 NOTE — Progress Notes (Signed)
UR completed 

## 2013-05-18 NOTE — Progress Notes (Signed)
I spoke with and examined patient and agree with resident's note and plan of care for POD#1.  Tawana Scale, MD OB Fellow 05/18/2013 8:37 AM

## 2013-05-18 NOTE — Progress Notes (Signed)
Subjective: Postpartum Day 1: Cesarean Delivery Patient reports tolerating PO and + flatus. Foley cath still in place    Still with sig amount of incisional pain, per RN Larey Brick pt's honeycomb was 50% saturated yesterday and is s/p removal and replacement  Objective: Vital signs in last 24 hours: Temp:  [97.3 F (36.3 C)-98.5 F (36.9 C)] 98.3 F (36.8 C) (10/28 0430) Pulse Rate:  [43-69] 56 (10/28 0430) Resp:  [14-23] 20 (10/28 0430) BP: (81-114)/(43-74) 96/50 mmHg (10/28 0430) SpO2:  [95 %-100 %] 97 % (10/28 0430) Weight:  [74.844 kg (165 lb)] 74.844 kg (165 lb) (10/27 1616)  Physical Exam:  General: alert, cooperative and no distress Lochia: appropriate Uterine Fundus: firm Incision: healing well, no significant drainage, no significant erythema DVT Evaluation: No evidence of DVT seen on physical exam.   Recent Labs  05/18/13 0605  HGB 9.4*  HCT 26.6*    Assessment/Plan: Status post Cesarean section. Doing well postoperatively.  Continue current care. Encourage increased ambulation today Voiding trial without foley Plans to bottle feed and use Nexplanon for contraception Likely d/c home tmw  Anselm Lis 05/18/2013, 7:07 AM

## 2013-05-18 NOTE — Anesthesia Postprocedure Evaluation (Signed)
Anesthesia Post Note  Patient: [name]  Procedure(s) Performed: Procedure(s) (LRB): REPEAT CESAREAN SECTION (N/A)  Anesthesia type: Spinal  Patient location: Mother/Baby  Post pain: Pain level controlled  Post assessment: Post-op Vital signs reviewed  Last Vitals:  Filed Vitals:   05/18/13 0430  BP: 96/50  Pulse: 56  Temp: 36.8 C  Resp: 20    Post vital signs: Reviewed  Level of consciousness: awake  Complications: No apparent anesthesia complications

## 2013-05-19 MED ORDER — IBUPROFEN 600 MG PO TABS: 600.0000 mg | ORAL_TABLET | Freq: Four times a day (QID) | ORAL | Status: DC

## 2013-05-19 MED ORDER — OXYCODONE-ACETAMINOPHEN 5-325 MG PO TABS: 1.0000 | ORAL_TABLET | ORAL | Status: DC | PRN
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
## 2013-05-19 NOTE — Discharge Summary (Signed)
Obstetric Discharge Summary Reason for Admission: cesarean section Prenatal Procedures: NST Intrapartum Procedures: cesarean: low cervical, transverse Postpartum Procedures: none Complications-Operative and Postpartum: none Hemoglobin  Date Value Range Status  05/18/2013 9.4* 12.0 - 15.0 g/dL Final     HCT  Date Value Range Status  05/18/2013 26.6* 36.0 - 46.0 % Final    Physical Exam:  General: alert, cooperative and no distress Lochia: appropriate Uterine Fundus: firm Incision: healing well, no significant drainage, no dehiscence, no significant erythema DVT Evaluation: No evidence of DVT seen on physical exam.  Discharge Diagnoses: Term Pregnancy-delivered  Discharge Information: Date: 05/19/2013 Activity: pelvic rest Diet: routine Medications: Ibuprofen Condition: stable Instructions: refer to practice specific booklet Discharge to: home Follow-up Information   Follow up with [facility]. Schedule an appointment as soon as possible for a visit in 4 weeks. (for postpartum visit and Nexplanon)    Contact information:   [address] [place] [address] [phone]      Newborn Data: Live born female  Birth Weight: 7 lb 5.6 oz (3335 g) APGAR: 9, 9  Home with mother.  Hospital course: Pt was admitted for RLTCS. She is GBS positive however did not require antibiotics as AROM occurred at the time of delivery.There were no delivery complications. Her postpartum course was uncomplicated. She has a history of substance abuse (cocaine, opiates) with UDS negative during pregnancy. Social work was consulted and will follow up as indicated. She is bottle feeding. She plans to use Nexplanon for postpartum contraception. She will follow up at [facility] for her postpartum care.   C[place]art, Ryann 05/19/2013, 7:35 AM  I have seen this patient and agree with the above resident's note.  LEFTWICH-KIRBY, LISA Certified Nurse-Midwife

## 2013-05-20 ENCOUNTER — Telehealth: Payer: Self-pay | Admitting: *Deleted

## 2013-05-20 NOTE — Telephone Encounter (Signed)
Pt states had c-section on 05/17/2013 continues to have pain at incision site. Requesting refill on Percocet 5/325 mg. Pt given appt for first of next week for incision check, however pt feels will need refill before this appt.

## 2013-05-21 NOTE — Discharge Summary (Signed)
Attestation of Attending Supervision of Advanced Practitioner (CNM/NP): Evaluation and management procedures were performed by the Advanced Practitioner under my supervision and collaboration.  I have reviewed the Advanced Practitioner's note and chart, and I agree with the management and plan.  Kameryn Tisdel 05/21/2013 9:28 AM

## 2013-05-24 ENCOUNTER — Encounter: Payer: Self-pay | Admitting: Obstetrics and Gynecology

## 2013-05-24 ENCOUNTER — Ambulatory Visit (INDEPENDENT_AMBULATORY_CARE_PROVIDER_SITE_OTHER): Payer: BC Managed Care – PPO | Admitting: Obstetrics and Gynecology

## 2013-05-24 VITALS — BP 120/80 | Ht 67.0 in | Wt 158.0 lb

## 2013-05-24 DIAGNOSIS — Z9889 Other specified postprocedural states: Secondary | ICD-10-CM

## 2013-05-24 MED ORDER — HYDROCHLOROTHIAZIDE 25 MG PO TABS: 25.0000 mg | ORAL_TABLET | Freq: Every day | ORAL | Status: DC

## 2013-05-24 MED ORDER — IBUPROFEN 600 MG PO TABS: 600.0000 mg | ORAL_TABLET | Freq: Four times a day (QID) | ORAL | Status: DC | PRN

## 2013-05-24 MED ORDER — HYDROCODONE-ACETAMINOPHEN 5-325 MG PO TABS: 1.0000 | ORAL_TABLET | Freq: Four times a day (QID) | ORAL | Status: DC | PRN

## 2013-05-24 NOTE — Progress Notes (Signed)
Patient ID: [name], female   DOB: [birth year], 27 y.o.   MRN: [id number] Pt here today for incision check. Pt states that she is still having pain. Pt states that she still feels pressure at the spinal site. ROS: tingling at ends of incision, has consumed all the 20 percocet , wants a milder med. Taking tylenol and motrin, Bottle fdg. Physical Examination: General appearance - alert, well appearing, and in no distress, normal appearing weight and anxious Mental status - alert, oriented to person, place, and time, normal mood, behavior, speech, dress, motor activity, and thought processes Abdomen - soft, nontender, nondistended, no masses or organomegaly scars from previous incisions excellent appearance, slight puffy, no erythema, no hematoma Breasts - breasts appear normal, engorged, still not interested in br fdg due to "bad dream with first baby" Back exam - full range of motion, no tenderness, palpable spasm or pain on motion, no erythema at site of spinal Skin - normal coloration and turgor, no rashes, no suspicious skin lesions noted abd healing nicely A normal postop course    Bot Fdg    Mild postop pain P: Rx vicodin      rx motin 600  Rx Hctz x 1 wk

## 2013-06-21 ENCOUNTER — Encounter: Payer: Self-pay | Admitting: Women's Health

## 2013-06-21 ENCOUNTER — Ambulatory Visit (INDEPENDENT_AMBULATORY_CARE_PROVIDER_SITE_OTHER): Payer: Medicaid Other | Admitting: Women's Health

## 2013-06-21 ENCOUNTER — Encounter (INDEPENDENT_AMBULATORY_CARE_PROVIDER_SITE_OTHER): Payer: Self-pay

## 2013-06-21 VITALS — BP 100/68 | Ht 67.0 in | Wt 158.0 lb

## 2013-06-21 DIAGNOSIS — Z3480 Encounter for supervision of other normal pregnancy, unspecified trimester: Secondary | ICD-10-CM

## 2013-06-21 DIAGNOSIS — O99345 Other mental disorders complicating the puerperium: Secondary | ICD-10-CM

## 2013-06-21 DIAGNOSIS — F53 Postpartum depression: Secondary | ICD-10-CM

## 2013-06-21 MED ORDER — ESCITALOPRAM OXALATE 10 MG PO TABS: 10.0000 mg | ORAL_TABLET | Freq: Every day | ORAL | Status: DC

## 2013-06-21 NOTE — Progress Notes (Signed)
Patient ID: [name], female   DOB: 22-Nov-1985, 27 y.o.   MRN: [id number] Subjective:    [name] is a 27 y.o. G69P2012 Caucasian female who presents for a postpartum visit. She is 5 weeks postpartum following a repeat cesarean section, low transverse incision at 39.1 gestational weeks. Anesthesia: spinal. I have fully reviewed the prenatal and intrapartum course. Postpartum course has been complicated by feelings of depression. Baby's course has been uncomplicated. Baby is feeding by bottle. Bleeding no bleeding. Bowel function is constipation w/ bm q 3-4d. . Bladder function is normal. Patient is sexually active. Had sex last night for first time, used condom. Contraception method is condoms and desires nexplanon. . Postpartum depression screening: positive. Score 15. Reports crying easily, not sleeping hardly at all, decreased appetite, not really finding joy in much. Denies SI/HI/II. Discussed option of SSRIs and referral to [facility], pt would like to try both.   The following portions of the patient's history were reviewed and updated as appropriate: allergies, current medications, past medical history, past surgical history and problem list.  Review of Systems Pertinent items are noted in HPI.   Filed Vitals:   06/21/13 1521  BP: 100/68  Height: 5\' 7"  (1.702 m)  Weight: 158 lb (71.668 kg)    Objective:     General:  alert, cooperative and no distress   Breasts:  deferred, no complaints  Lungs: clear to auscultation bilaterally  Heart:  regular rate and rhythm  Abdomen: soft, nontender, incision well healed   Vulva: normal  Vagina: normal vagina  Cervix:  closed  Corpus: Well-involuted  Adnexa:  Non-palpable  Rectal Exam: No hemorrhoids        Assessment:   Postpartum exam 5 wks s/p RLTCS Depression screening PPD Contraception counseling   Plan:   Contraception: desires nexplanon No sex until after nexplanon placed Follow up in: 12/10 or after  for bhcg am and nexplanon insertion pm  Rx lexapro 10mg  daily #30 w/ 3RF Referral to [facility] faxed F/U 4wks to see how lexapro is doing  [name] 06/21/2013 3:54 PM

## 2013-06-21 NOTE — Patient Instructions (Addendum)
No sex until after nexplanon placed  Constipation  Drink plenty of fluid, preferably water, throughout the day  Eat foods high in fiber such as fruits, vegetables, and grains  Exercise, such as walking, is a good way to keep your bowels regular  Drink warm fluids, especially warm prune juice, or decaf coffee  Eat a 1/2 cup of real oatmeal (not instant), 1/2 cup applesauce, and 1/2-1 cup warm prune juice every day  If needed, you may take Colace (docusate sodium) stool softener once or twice a day to help keep the stool soft. If you are pregnant, wait until you are out of your first trimester (12-14 weeks of pregnancy)  If you still are having problems with constipation, you may take Miralax once daily as needed to help keep your bowels regular.  If you are pregnant, wait until you are out of your first trimester (12-14 weeks of pregnancy)    Postpartum Depression and Baby Blues The postpartum period begins right after the birth of a baby. During this time, there is often a great amount of joy and excitement. It is also a time of considerable changes in the life of the parent(s). Regardless of how many times a mother gives birth, each child brings new challenges and dynamics to the family. It is not unusual to have feelings of excitement accompanied by confusing shifts in moods, emotions, and thoughts. All mothers are at risk of developing postpartum depression or the "baby blues." These mood changes can occur right after giving birth, or they may occur many months after giving birth. The baby blues or postpartum depression can be mild or severe. Additionally, postpartum depression can resolve rather quickly, or it can be a long-term condition. CAUSES Elevated hormones and their rapid decline are thought to be a main cause of postpartum depression and the baby blues. There are a number of hormones that radically change during and after pregnancy. Estrogen and progesterone usually decrease  immediately after delivering your baby. The level of thyroid hormone and various cortisol steroids also rapidly drop. Other factors that play a major role in these changes include major life events and genetics.  RISK FACTORS If you have any of the following risks for the baby blues or postpartum depression, know what symptoms to watch out for during the postpartum period. Risk factors that may increase the likelihood of getting the baby blues or postpartum depression include:  Havinga personal or family history of depression.  Having depression while being pregnant.  Having premenstrual or oral contraceptive-associated mood issues.  Having exceptional life stress.  Having marital conflict.  Lacking a social support network.  Having a baby with special needs.  Having health problems such as diabetes. SYMPTOMS Baby blues symptoms include:  Brief fluctuations in mood, such as going from extreme happiness to sadness.  Decreased concentration.  Difficulty sleeping.  Crying spells, tearfulness.  Irritability.  Anxiety. Postpartum depression symptoms typically begin within the first month after giving birth. These symptoms include:  Difficulty sleeping or excessive sleepiness.  Marked weight loss.  Agitation.  Feelings of worthlessness.  Lack of interest in activity or food. Postpartum psychosis is a very concerning condition and can be dangerous. Fortunately, it is rare. Displaying any of the following symptoms is cause for immediate medical attention. Postpartum psychosis symptoms include:  Hallucinations and delusions.  Bizarre or disorganized behavior.  Confusion or disorientation. DIAGNOSIS  A diagnosis is made by an evaluation of your symptoms. There are no medical or lab tests that lead  to a diagnosis, but there are various questionnaires that a caregiver may use to identify those with the baby blues, postpartum depression, or psychosis. Often times, a screening  tool called the [place] Postnatal Depression Scale is used to diagnose depression in the postpartum period.  TREATMENT The baby blues usually goes away on its own in 1 to 2 weeks. Social support is often all that is needed. You should be encouraged to get adequate sleep and rest. Occasionally, you may be given medicines to help you sleep.  Postpartum depression requires treatment as it can last several months or longer if it is not treated. Treatment may include individual or group therapy, medicine, or both to address any social, physiological, and psychological factors that may play a role in the depression. Regular exercise, a healthy diet, rest, and social support may also be strongly recommended.  Postpartum psychosis is more serious and needs treatment right away. Hospitalization is often needed. HOME CARE INSTRUCTIONS  Get as much rest as you can. Nap when the baby sleeps.  Exercise regularly. Some women find yoga and walking to be beneficial.  Eat a balanced and nourishing diet.  Do little things that you enjoy. Have a cup of tea, take a bubble bath, read your favorite magazine, or listen to your favorite music.  Avoid alcohol.  Ask for help with household chores, cooking, grocery shopping, or running errands as needed. Do not try to do everything.  Talk to people close to you about how you are feeling. Get support from your partner, family members, friends, or other new moms.  Try to stay positive in how you think. Think about the things you are grateful for.  Do not spend a lot of time alone.  Only take medicine as directed by your caregiver.  Keep all your postpartum appointments.  Let your caregiver know if you have any concerns. SEEK MEDICAL CARE IF: You are having a reaction or problems with your medicine. SEEK IMMEDIATE MEDICAL CARE IF:  You have suicidal feelings.  You feel you may harm the baby or someone else. Document Released: 04/11/2004 Document Revised:  09/30/2011 Document Reviewed: 05/14/2011 [facility] Patient Information 2014 [place], [place].

## 2013-06-21 NOTE — Addendum Note (Signed)
Addended by: Cheral Marker on: 06/21/2013 03:59 PM   Modules accepted: Level of Service

## 2013-07-01 ENCOUNTER — Encounter (INDEPENDENT_AMBULATORY_CARE_PROVIDER_SITE_OTHER): Payer: Self-pay

## 2013-07-01 ENCOUNTER — Ambulatory Visit (INDEPENDENT_AMBULATORY_CARE_PROVIDER_SITE_OTHER): Payer: BC Managed Care – PPO | Admitting: Advanced Practice Midwife

## 2013-07-01 ENCOUNTER — Other Ambulatory Visit: Payer: BC Managed Care – PPO

## 2013-07-01 ENCOUNTER — Encounter: Payer: Self-pay | Admitting: Advanced Practice Midwife

## 2013-07-01 VITALS — BP 82/54 | Ht 67.0 in | Wt 160.0 lb

## 2013-07-01 DIAGNOSIS — Z3202 Encounter for pregnancy test, result negative: Secondary | ICD-10-CM

## 2013-07-01 DIAGNOSIS — Z30017 Encounter for initial prescription of implantable subdermal contraceptive: Secondary | ICD-10-CM

## 2013-07-01 NOTE — Progress Notes (Addendum)
[name] is a 27 y.o. year old Caucasian female here for Nexplanon insertion. She has not been sexually active since her c/s and her pregnancy test today was negative.  Risks/benefits/side effects of Nexplanon have been discussed and her questions have been answered.  Specifically, a failure rate of 07/998 has been reported, with an increased failure rate if pt takes St. John's Wort and/or antiseizure medicaitons.  [name] is aware of the common side effect of irregular bleeding, which the incidence of decreases over time.  Her left arm, approximatly 4 inches proximal from the elbow, was cleansed with alcohol and anesthetized with 2cc of 2% Lidocaine.  The area was cleansed again and the Nexplanon was inserted without difficulty.  A pressure bandage was applied.  Pt was instructed to remove pressure bandage in a few hours, and keep insertion site covered with a bandaid for 3 days.  Back up contraception was recommended for 2 weeks.  Follow-up scheduled PRN problems  Quit taking Lexapro due to wooziness and HA.  Offered a different SSRI, but declined.  Has an appt with [name].  Promises to contact me if PPD doesn't improve soon,   CRESENZO-DISHMAN,Avy Barlett 07/01/2013 3:38 PM

## 2013-07-19 ENCOUNTER — Telehealth: Payer: Self-pay | Admitting: Obstetrics & Gynecology

## 2013-07-19 NOTE — Telephone Encounter (Signed)
Pt states that she needs a note to return to work. Fax [phone]. Pt states that she was supposed to be out of work until 07-23-13. Pt going back to work on Sunday. Pt states that she has been bleeding for about 3 weeks on the nexplanon can we call in something in for her.

## 2013-07-21 ENCOUNTER — Telehealth: Payer: Self-pay | Admitting: Advanced Practice Midwife

## 2013-07-21 NOTE — Telephone Encounter (Signed)
Pt states been having abnormal bleeding since receiving nexplanon 3 weeks ago. Pt requesting medication to stop bleeding. Per Cyril Mourning, NP, pt needs to have nexplanon for 1 month before manipulating bleeding with medication. Pt verbalized understanding.

## 2013-07-23 ENCOUNTER — Telehealth: Payer: Self-pay | Admitting: *Deleted

## 2013-07-23 NOTE — Telephone Encounter (Signed)
Return to work letter faxed to [phone].

## 2013-08-19 ENCOUNTER — Other Ambulatory Visit (HOSPITAL_COMMUNITY): Payer: Self-pay | Admitting: Otolaryngology

## 2013-08-19 DIAGNOSIS — T148XXA Other injury of unspecified body region, initial encounter: Secondary | ICD-10-CM

## 2013-08-20 ENCOUNTER — Ambulatory Visit (HOSPITAL_COMMUNITY): Payer: BC Managed Care – PPO

## 2013-08-24 ENCOUNTER — Ambulatory Visit (HOSPITAL_COMMUNITY)
Admission: RE | Admit: 2013-08-24 | Discharge: 2013-08-24 | Disposition: A | Discharge status: Home / Self Care | Payer: BC Managed Care – PPO | Source: Ambulatory Visit | Attending: Otolaryngology | Admitting: Otolaryngology

## 2013-08-24 DIAGNOSIS — Z9889 Other specified postprocedural states: Secondary | ICD-10-CM | POA: Insufficient documentation

## 2013-08-24 DIAGNOSIS — IMO0001 Reserved for inherently not codable concepts without codable children: Secondary | ICD-10-CM | POA: Insufficient documentation

## 2013-08-24 DIAGNOSIS — T148XXA Other injury of unspecified body region, initial encounter: Secondary | ICD-10-CM
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
## 2014-05-23 ENCOUNTER — Encounter: Payer: Self-pay | Admitting: Advanced Practice Midwife

## 2014-07-18 ENCOUNTER — Emergency Department (HOSPITAL_COMMUNITY): Payer: No Typology Code available for payment source

## 2014-07-18 ENCOUNTER — Encounter (HOSPITAL_COMMUNITY): Payer: Self-pay | Admitting: Emergency Medicine

## 2014-07-18 ENCOUNTER — Emergency Department (HOSPITAL_COMMUNITY)
Admission: EM | Admit: 2014-07-18 | Discharge: 2014-07-18 | Disposition: A | Discharge status: Home / Self Care | Payer: No Typology Code available for payment source | Attending: Emergency Medicine | Admitting: Emergency Medicine

## 2014-07-18 DIAGNOSIS — D649 Anemia, unspecified: Secondary | ICD-10-CM | POA: Diagnosis not present

## 2014-07-18 DIAGNOSIS — Z720 Tobacco use: Secondary | ICD-10-CM | POA: Insufficient documentation

## 2014-07-18 DIAGNOSIS — Y9389 Activity, other specified: Secondary | ICD-10-CM | POA: Insufficient documentation

## 2014-07-18 DIAGNOSIS — Z79899 Other long term (current) drug therapy: Secondary | ICD-10-CM | POA: Diagnosis not present

## 2014-07-18 DIAGNOSIS — Z8742 Personal history of other diseases of the female genital tract: Secondary | ICD-10-CM | POA: Insufficient documentation

## 2014-07-18 DIAGNOSIS — Y998 Other external cause status: Secondary | ICD-10-CM | POA: Diagnosis not present

## 2014-07-18 DIAGNOSIS — S3992XA Unspecified injury of lower back, initial encounter: Secondary | ICD-10-CM | POA: Insufficient documentation

## 2014-07-18 DIAGNOSIS — Y92410 Unspecified street and highway as the place of occurrence of the external cause: Secondary | ICD-10-CM | POA: Insufficient documentation

## 2014-07-18 DIAGNOSIS — Z3202 Encounter for pregnancy test, result negative: Secondary | ICD-10-CM | POA: Diagnosis not present

## 2014-07-18 DIAGNOSIS — T07XXXA Unspecified multiple injuries, initial encounter: Secondary | ICD-10-CM

## 2014-07-18 LAB — POC URINE PREG, ED: PREG TEST UR: NEGATIVE

## 2014-07-18 MED ORDER — IBUPROFEN 800 MG PO TABS: 800.0000 mg | ORAL_TABLET | Freq: Three times a day (TID) | ORAL | Status: DC

## 2014-07-18 MED ORDER — METHOCARBAMOL 500 MG PO TABS: 500.0000 mg | ORAL_TABLET | Freq: Three times a day (TID) | ORAL | Status: DC
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
## 2014-11-23 ENCOUNTER — Encounter (HOSPITAL_COMMUNITY): Payer: Self-pay | Admitting: Emergency Medicine

## 2014-11-23 ENCOUNTER — Emergency Department (HOSPITAL_COMMUNITY)
Admission: EM | Admit: 2014-11-23 | Discharge: 2014-11-23 | Disposition: A | Discharge status: Home / Self Care | Payer: Medicaid Other | Attending: Emergency Medicine | Admitting: Emergency Medicine

## 2014-11-23 DIAGNOSIS — Z720 Tobacco use: Secondary | ICD-10-CM | POA: Insufficient documentation

## 2014-11-23 DIAGNOSIS — H109 Unspecified conjunctivitis: Secondary | ICD-10-CM | POA: Insufficient documentation

## 2014-11-23 DIAGNOSIS — Z8742 Personal history of other diseases of the female genital tract: Secondary | ICD-10-CM | POA: Insufficient documentation

## 2014-11-23 DIAGNOSIS — Z79899 Other long term (current) drug therapy: Secondary | ICD-10-CM | POA: Insufficient documentation

## 2014-11-23 DIAGNOSIS — D649 Anemia, unspecified: Secondary | ICD-10-CM | POA: Insufficient documentation

## 2014-11-23 LAB — CBG MONITORING, ED: GLUCOSE-CAPILLARY: 151 mg/dL — AB (ref 70–99)

## 2014-11-23 MED ORDER — FLUORESCEIN SODIUM 1 MG OP STRP
1.0000 | ORAL_STRIP | Freq: Once | OPHTHALMIC | Status: AC
  Administered 2014-11-23: 1 via OPHTHALMIC
  Filled 2014-11-23: qty 1

## 2014-11-23 MED ORDER — TETRACAINE HCL 0.5 % OP SOLN
2.0000 [drp] | Freq: Once | OPHTHALMIC | Status: AC
  Administered 2014-11-23: 2 [drp] via OPHTHALMIC
  Filled 2014-11-23: qty 2

## 2014-11-23 MED ORDER — TOBRAMYCIN 0.3 % OP SOLN
2.0000 [drp] | OPHTHALMIC | Status: DC
  Administered 2014-11-23: 2 [drp] via OPHTHALMIC
  Filled 2014-11-23: qty 5

## 2014-11-23 NOTE — Discharge Instructions (Signed)
°  Follow up with Dr. Lita MainsHaines. Apply cool wet compresses to the face and eyes. Use the eye drops every 4 hours.   Bacterial Conjunctivitis Bacterial conjunctivitis (commonly called pink eye) is redness, soreness, or puffiness (inflammation) of the white part of your eye. It is caused by a germ called bacteria. These germs can easily spread from person to person (contagious). Your eye often will become red or pink. Your eye may also become irritated, watery, or have a thick discharge.  HOME CARE   Apply a cool, clean washcloth over closed eyelids. Do this for 10-20 minutes, 3-4 times a day while you have pain.  Gently wipe away any fluid coming from the eye with a warm, wet washcloth or cotton ball.  Wash your hands often with soap and water. Use paper towels to dry your hands.  Do not share towels or washcloths.  Change or wash your pillowcase every day.  Do not use eye makeup until the infection is gone.  Do not use machines or drive if your vision is blurry.  Stop using contact lenses. Do not use them again until your doctor says it is okay.  Do not touch the tip of the eye drop bottle or medicine tube with your fingers when you put medicine on the eye. GET HELP RIGHT AWAY IF:   Your eye is not better after 3 days of starting your medicine.  You have a yellowish fluid coming out of the eye.  You have more pain in the eye.  Your eye redness is spreading.  Your vision becomes blurry.  You have a fever or lasting symptoms for more than 2-3 days.  You have a fever and your symptoms suddenly get worse.  You have pain in the face.  Your face gets red or puffy (swollen). MAKE SURE YOU:   Understand these instructions.  Will watch this condition.  Will get help right away if you are not doing well or get worse. Document Released: 04/16/2008 Document Revised: 06/24/2012 Document Reviewed: 03/13/2012 [facility] Patient Information 2015 [employer] [place]. This information is not  intended to replace advice given to you by your health care provider. Make sure you discuss any questions you have with your health care provider.

## 2014-11-23 NOTE — ED Notes (Signed)
Pt reports left eye irritation and burning for last several days. Pt denies any known substance in eye or recent trauma to eye. Pt reports "everything looks blurry in left eye."  nad noted.

## 2014-11-23 NOTE — ED Provider Notes (Signed)
CSN: [id number]     Arrival date & time 11/23/14  1321 History   First MD Initiated Contact with Patient 11/23/14 1435     Chief Complaint  Patient presents with  . Eye Problem     (Consider location/radiation/quality/duration/timing/severity/associated sxs/prior Treatment) Patient is a 29 y.o. female presenting with eye problem. The history is provided by the patient.  Eye Problem Location:  L eye Quality:  Burning Severity:  Moderate Onset quality:  Gradual Duration:  1 week Timing:  Constant Progression:  Worsening Chronicity:  New Context: contact lenses   Relieved by:  Nothing Worsened by:  Bright light and contact lenses Ineffective treatments:  Closing eye, darkened room and sleep Associated symptoms: foreign body sensation, inflammation, itching, photophobia and redness    [name] is a 29 y.o. female who presents to the ED with left eye irritation that started several days ago and has gotten worse. She does not remember anything getting in her eye and has had no trauma to the eye. She complains of blurry vision. She does sleep in her contact lenses sometimes.   Past Medical History  Diagnosis Date  . Smoker   . Anemia   . Other and unspecified ovarian cysts     Hx of   . History of cocaine abuse     opioid   Past Surgical History  Procedure Laterality Date  . Tonsillectomy    . Cesarean section    . Oophorectomy Right     mucinous cystadenoma  . Open reduction internal fixation (orif) distal radial fracture Right 09/17/2012    Procedure: OPEN REDUCTION INTERNAL FIXATION (ORIF) DISTAL RADIAL FRACTURE;  Surgeon: Budd Palmer, MD;  Location: [facility] OR;  Service: Orthopedics;  Laterality: Right;  . Orif ankle fracture Right 09/17/2012    Procedure: OPEN REDUCTION INTERNAL FIXATION (ORIF) ANKLE FRACTURE;  Surgeon: Budd Palmer, MD;  Location: [facility] OR;  Service: Orthopedics;  Laterality: Right;  . Orif zygomatic fracture Right 09/22/2012    Procedure: OPEN  REDUCTION INTERNAL FIXATION (ORIF) ZYGOMATIC FRACTURE ORBITAL FLOOR EXPLORATION WITH FROST STITCH ;  Surgeon: Flo Shanks, MD;  Location: [facility] OR;  Service: ENT;  Laterality: Right;  Repair of lip laceration  . Cesarean section N/A 05/17/2013    Procedure: REPEAT CESAREAN SECTION;  Surgeon: [name], MD;  Location: [facility] ORS;  Service: Obstetrics;  Laterality: N/A;  . Eye surgery     Family History  Problem Relation Age of Onset  . Cancer Paternal Grandmother     breast  . Hypertension Other   . Diabetes Other   . Coronary artery disease Other   . Cancer Father     prostate and esophageal   History  Substance Use Topics  . Smoking status: Current Every Day Smoker -- 1.00 packs/day for 10 years    Types: Cigarettes  . Smokeless tobacco: Never Used  . Alcohol Use: No     Comment: occasional; not now   OB History    Gravida Para Term Preterm AB TAB SAB Ectopic Multiple Living   Review of Systems  Eyes: Positive for photophobia, redness, itching and visual disturbance.  all other systems negative    Allergies  Review of patient's allergies indicates no known allergies.  Home Medications   Prior to Admission medications   Medication Sig Start Date End Date Taking? Authorizing Provider  etonogestrel (NEXPLANON) 68 MG IMPL implant Inject 1  each into the skin once.   Yes Historical Provider, MD  escitalopram (LEXAPRO) 10 MG tablet Take 1 tablet (10 mg total) by mouth daily. Patient not taking: Reported on 07/18/2014 06/21/13   Cheral MarkerKimberly R Booker, CNM  ferrous sulfate 325 (65 FE) MG tablet Take 1 tablet (325 mg total) by mouth daily with breakfast. Patient not taking: Reported on 07/18/2014 04/26/13   Cheral MarkerKimberly R Booker, CNM  hydrochlorothiazide (HYDRODIURIL) 25 MG tablet Take 1 tablet (25 mg total) by mouth daily. Patient not taking: Reported on 07/18/2014 05/24/13   Tilda BurrowJohn Ferguson V, MD  HYDROcodone-acetaminophen (NORCO/VICODIN) 5-325 MG per tablet Take 1  tablet by mouth every 6 (six) hours as needed for pain. Patient not taking: Reported on 07/18/2014 05/24/13   Tilda BurrowJohn Ferguson V, MD  ibuprofen (ADVIL,MOTRIN) 600 MG tablet Take 1 tablet (600 mg total) by mouth every 6 (six) hours. Patient not taking: Reported on 07/18/2014 05/19/13   Ryann Cowart, MD  ibuprofen (ADVIL,MOTRIN) 800 MG tablet Take 1 tablet (800 mg total) by mouth 3 (three) times daily. Patient not taking: Reported on 11/23/2014 07/18/14   Ivery QualeHobson Bryant, PA-C  methocarbamol (ROBAXIN) 500 MG tablet Take 1 tablet (500 mg total) by mouth 3 (three) times daily. Patient not taking: Reported on 11/23/2014 07/18/14   Ivery QualeHobson Bryant, PA-C  oxyCODONE-acetaminophen (PERCOCET/ROXICET) 5-325 MG per tablet Take 1-2 tablets by mouth every 4 (four) hours as needed. Patient not taking: Reported on 07/18/2014 05/19/13   Hal Neeryann Cowart, MD  Prenatal Vit-Fe Fumarate-FA (PRENATAL MULTIVITAMIN) TABS Take 1 tablet by mouth daily. Patient not taking: Reported on 07/18/2014 09/23/12   Freeman CaldronMichael J Jeffery, PA-C   BP 111/72 mmHg  Pulse 66  Temp(Src) 98.2 F (36.8 C) (Oral)  Resp 16  Ht 5\' 7"  (1.702 m)  Wt 150 lb (68.04 kg)  BMI 23.49 kg/m2  SpO2 100%  LMP 10/05/2014  Breastfeeding? No Physical Exam  Constitutional: She is oriented to person, place, and time. She appears well-developed and well-nourished.  HENT:  Head: Normocephalic.  Eyes: EOM and lids are normal. Lids are everted and swept, no foreign bodies found. Right conjunctiva is injected.  Slit lamp exam:      The right eye shows no corneal abrasion and no foreign body.       The left eye shows no corneal abrasion, no foreign body and no fluorescein uptake.  Neck: Normal range of motion. Neck supple.  Cardiovascular: Normal rate.   Pulmonary/Chest: Effort normal.  Abdominal: Soft. There is no tenderness.  Musculoskeletal: Normal range of motion.  Neurological: She is alert and oriented to person, place, and time. No cranial nerve deficit.  Skin:  Skin is warm and dry.  Psychiatric: She has a normal mood and affect. Her behavior is normal.  Nursing note and vitals reviewed.   ED Course  Procedures (including critical care time) Unable to do visual acuity because patient does not have glasses with her.  Labs Review Results for orders placed or performed during the hospital encounter of 11/23/14 (from the past 24 hour(s))  CBG monitoring, ED     Status: Abnormal   Collection Time: 11/23/14  2:45 PM  Result Value Ref Range   Glucose-Capillary 151 (H) 70 - 99 mg/dL    Slit lamp exam done by me and by Dr. Juleen ChinaKohut. No corneal abrasion noted.  MDM  29 y.o. female with bilateral eye irritation, left worse than right, most likely due to sleeping in contact lenses. Will treat for conjunctivitis with Tobramycin Opth. Drops. First  dose instilled here in the ED prior to d/c . Patient is to follow up with Dr. Lita MainsHaines, Opthalmologic. She will return here as needed. Discussed with the patient and all questioned fully answered.   Final diagnoses:  Bilateral conjunctivitis      Janne NapoleonHope M Neese, NP 11/23/14 1525  [name], MD 11/26/14 2022

## 2014-12-24 ENCOUNTER — Emergency Department (HOSPITAL_COMMUNITY)
Admission: EM | Admit: 2014-12-24 | Discharge: 2014-12-24 | Disposition: A | Discharge status: Home / Self Care | Payer: MEDICAID | Attending: Emergency Medicine | Admitting: Emergency Medicine

## 2014-12-24 ENCOUNTER — Encounter (HOSPITAL_COMMUNITY): Payer: Self-pay | Admitting: *Deleted

## 2014-12-24 DIAGNOSIS — Z862 Personal history of diseases of the blood and blood-forming organs and certain disorders involving the immune mechanism: Secondary | ICD-10-CM | POA: Insufficient documentation

## 2014-12-24 DIAGNOSIS — Z79899 Other long term (current) drug therapy: Secondary | ICD-10-CM | POA: Insufficient documentation

## 2014-12-24 DIAGNOSIS — F329 Major depressive disorder, single episode, unspecified: Secondary | ICD-10-CM | POA: Diagnosis not present

## 2014-12-24 DIAGNOSIS — M549 Dorsalgia, unspecified: Secondary | ICD-10-CM | POA: Insufficient documentation

## 2014-12-24 DIAGNOSIS — F419 Anxiety disorder, unspecified: Secondary | ICD-10-CM | POA: Insufficient documentation

## 2014-12-24 DIAGNOSIS — Z720 Tobacco use: Secondary | ICD-10-CM | POA: Insufficient documentation

## 2014-12-24 MED ORDER — LORAZEPAM 1 MG PO TABS: 1.0000 mg | ORAL_TABLET | Freq: Two times a day (BID) | ORAL | Status: DC | PRN
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
## 2014-12-24 NOTE — Discharge Instructions (Signed)
Read the information below.  You may return to the Emergency Department at any time for worsening condition or any new symptoms that concern you.  If you feel unsafe or feel that you are a danger to yourself or others, call 911 or return to the ER immediately for a recheck.  If you have any episodes of slow heart rate and feel you are going to pass out, please dial 911 to be evaluated as quickly as possible in the emergency department.    Panic Attacks Panic attacks are sudden, short-livedsurges of severe anxiety, fear, or discomfort. They may occur for no reason when you are relaxed, when you are anxious, or when you are sleeping. Panic attacks may occur for a number of reasons:  1. Healthy people occasionally have panic attacks in extreme, life-threatening situations, such as war or natural disasters. Normal anxiety is a protective mechanism of the body that helps [place] react to danger (fight or flight response). 2. Panic attacks are often seen with anxiety disorders, such as panic disorder, social anxiety disorder, generalized anxiety disorder, and phobias. Anxiety disorders cause excessive or uncontrollable anxiety. They may interfere with your relationships or other life activities. 3. Panic attacks are sometimes seen with other mental illnesses, such as depression and posttraumatic stress disorder. 4. Certain medical conditions, prescription medicines, and drugs of abuse can cause panic attacks. SYMPTOMS  Panic attacks start suddenly, peak within 20 minutes, and are accompanied by four or more of the following symptoms:  Pounding heart or fast heart rate (palpitations).  Sweating.  Trembling or shaking.  Shortness of breath or feeling smothered.  Feeling choked.  Chest pain or discomfort.  Nausea or strange feeling in your stomach.  Dizziness, light-headedness, or feeling like you will faint.  Chills or hot flushes.  Numbness or tingling in your lips or hands and feet.  Feeling  that things are not real or feeling that you are not yourself.  Fear of losing control or going crazy.  Fear of dying. Some of these symptoms can mimic serious medical conditions. For example, you may think you are having a heart attack. Although panic attacks can be very scary, they are not life threatening. DIAGNOSIS  Panic attacks are diagnosed through an assessment by your health care provider. Your health care provider will ask questions about your symptoms, such as where and when they occurred. Your health care provider will also ask about your medical history and use of alcohol and drugs, including prescription medicines. Your health care provider may order blood tests or other studies to rule out a serious medical condition. Your health care provider may refer you to a mental health professional for further evaluation. TREATMENT   Most healthy people who have one or two panic attacks in an extreme, life-threatening situation will not require treatment.  The treatment for panic attacks associated with anxiety disorders or other mental illness typically involves counseling with a mental health professional, medicine, or a combination of both. Your health care provider will help determine what treatment is best for you.  Panic attacks due to physical illness usually go away with treatment of the illness. If prescription medicine is causing panic attacks, talk with your health care provider about stopping the medicine, decreasing the dose, or substituting another medicine.  Panic attacks due to alcohol or drug abuse go away with abstinence. Some adults need professional help in order to stop drinking or using drugs. HOME CARE INSTRUCTIONS   Take all medicines as directed by your  health care provider.   Schedule and attend follow-up visits as directed by your health care provider. It is important to keep all your appointments. SEEK MEDICAL CARE IF:  You are not able to take your medicines  as prescribed.  Your symptoms do not improve or get worse. SEEK IMMEDIATE MEDICAL CARE IF:   You experience panic attack symptoms that are different than your usual symptoms.  You have serious thoughts about hurting yourself or others.  You are taking medicine for panic attacks and have a serious side effect. MAKE SURE YOU:  Understand these instructions.  Will watch your condition.  Will get help right away if you are not doing well or get worse. Document Released: 07/08/2005 Document Revised: 07/13/2013 Document Reviewed: 02/19/2013 [facility] Patient Information 2015 [place], [place]. This information is not intended to replace advice given to you by your health care provider. Make sure you discuss any questions you have with your health care provider.  Depression Depression refers to feeling sad, low, down in the dumps, blue, gloomy, or empty. In general, there are two kinds of depression: 5. Normal sadness or normal grief. This kind of depression is one that we all feel from time to time after upsetting life experiences, such as the loss of a job or the ending of a relationship. This kind of depression is considered normal, is short lived, and resolves within a few days to 2 weeks. Depression experienced after the loss of a loved one (bereavement) often lasts longer than 2 weeks but normally gets better with time. 6. Clinical depression. This kind of depression lasts longer than normal sadness or normal grief or interferes with your ability to function at home, at work, and in school. It also interferes with your personal relationships. It affects almost every aspect of your life. Clinical depression is an illness. Symptoms of depression can also be caused by conditions other than those mentioned above, such as:  Physical illness. Some physical illnesses, including underactive thyroid gland (hypothyroidism), severe anemia, specific types of cancer, diabetes, uncontrolled seizures, heart  and lung problems, strokes, and chronic pain are commonly associated with symptoms of depression.  Side effects of some prescription medicine. In some people, certain types of medicine can cause symptoms of depression.  Substance abuse. Abuse of alcohol and illicit drugs can cause symptoms of depression. SYMPTOMS Symptoms of normal sadness and normal grief include the following:  Feeling sad or crying for short periods of time.  Not caring about anything (apathy).  Difficulty sleeping or sleeping too much.  No longer able to enjoy the things you used to enjoy.  Desire to be by oneself all the time (social isolation).  Lack of energy or motivation.  Difficulty concentrating or remembering.  Change in appetite or weight.  Restlessness or agitation. Symptoms of clinical depression include the same symptoms of normal sadness or normal grief and also the following symptoms:  Feeling sad or crying all the time.  Feelings of guilt or worthlessness.  Feelings of hopelessness or helplessness.  Thoughts of suicide or the desire to harm yourself (suicidal ideation).  Loss of touch with reality (psychotic symptoms). Seeing or hearing things that are not real (hallucinations) or having false beliefs about your life or the people around you (delusions and paranoia). DIAGNOSIS  The diagnosis of clinical depression is usually based on how bad the symptoms are and how long they have lasted. Your health care provider will also ask you questions about your medical history and substance use to  find out if physical illness, use of prescription medicine, or substance abuse is causing your depression. Your health care provider may also order blood tests. TREATMENT  Often, normal sadness and normal grief do not require treatment. However, sometimes antidepressant medicine is given for bereavement to ease the depressive symptoms until they resolve. The treatment for clinical depression depends on how  bad the symptoms are but often includes antidepressant medicine, counseling with a mental health professional, or both. Your health care provider will help to determine what treatment is best for you. Depression caused by physical illness usually goes away with appropriate medical treatment of the illness. If prescription medicine is causing depression, talk with your health care provider about stopping the medicine, decreasing the dose, or changing to another medicine. Depression caused by the abuse of alcohol or illicit drugs goes away when you stop using these substances. Some adults need professional help in order to stop drinking or using drugs. SEEK IMMEDIATE MEDICAL CARE IF:  You have thoughts about hurting yourself or others.  You lose touch with reality (have psychotic symptoms).  You are taking medicine for depression and have a serious side effect. FOR MORE INFORMATION  National Alliance on Mental Illness: www.nami.[employer] of Mental Health: http://www.maynard.net/ Document Released: 07/05/2000 Document Revised: 11/22/2013 Document Reviewed: 10/07/2011 [facility] Patient Information 2015 [place], [place]. This information is not intended to replace advice given to you by your health care provider. Make sure you discuss any questions you have with your health care provider.  Behavioral Health Resources in the [facility]  Intensive Outpatient Programs: [facility]      [address] [place], [place] [phone] Both a day and evening program       [facility]     [address]        [place], [place] [address] [phone]         ADS: Alcohol & Drug Svcs [address] [place] [phone]  [facility] Mental Health ACCESS LINE: [phone] or [phone] [address] [place], [place] [address] [email or web address]  Mobile Crisis Teams:                                         Therapeutic Alternatives         Mobile Crisis Care Unit [phone]             [facility] [address] Ginette Otto [phone]                                         Interventionist [address] [place] [phone]  Self-Help/Support Groups: Mental Health Assoc. of [employer] of support groups [phone] (call for more info)  Narcotics Anonymous (NA) Caring Services [address] [place] - 2 meetings at this location  Residential Treatment Programs:  [facility]idential [facility]      [address]        [place]       [phone]         [facility] [address], [place] [address] [place], [place]  [address] [phone]  [facility] Treatment Facility  [address] [place], [place] [address] [phone] Admissions: 8am-3pm M-F  [facility]     [address]        [place], [place] [address]       [phone]         [facility] [address] Starling Manns [place], [place] [phone]  [facility] [address] [place], [place] [phone]  [facility] - Intensive Outpatient      [address]     [place], [place]       [phone]         [facility] ([facility]     [address] [place], [place] [phone] or [phone]  Residential Treatment Services (RTS)  [address] [place], [place] [phone]  [facility]ellowship [address]                                               [address] [place] [phone]  [facility] Resources: [phone]               General Therapy                                                Angie Fava, PhD        [address] [place], [place] [address]         [phone]   Insurance  [facility]al   [address] [place], [place] [address] [phone]  [facility] [address] [place], [place] [address] [phone] Insurance/Medicaid/sponsorship through [facility] and Families                                              [address]                                        [place], [place] [address]    Therapy/tele-psych/case         [phone]          [facility] [address], [place]  [address]  Adolescent/group home/case management [phone]                                           Creola Corn PhD       General therapy       Insurance   [phone]         Dr. Lolly Mustache Insurance [phone] M-F  [place] [facility]/Residential Medicaid, sponsorship [phone]    Emergency Department Resource Guide 1) Find a Doctor and Pay Out of Pocket Although you won't have to find out who is covered by your insurance plan, it is a good  idea to ask around and get recommendations. You will then need to call the office and see if the doctor you have chosen will accept you as a new patient and what types of options they offer for patients who are self-pay. Some doctors offer discounts or will set up payment plans for their patients who do not have insurance, but you will need to ask so you aren't surprised when you get to your appointment.  2) Contact Your Local Health Department Not all health departments have doctors that can see patients for sick visits, but many do, so it is worth a call to see if yours does. If you don't know where your local health department is, you can check in your phone book. The CDC also has a tool to help you locate your state's health department, and many state websites also have listings of all of their local health departments.  3) Find a Walk-in Clinic If your illness is not likely to be very severe or complicated, you may want to try a walk in clinic. These are popping up all over the country in pharmacies, drugstores, and shopping  centers. They're usually staffed by nurse practitioners or physician assistants that have been trained to treat common illnesses and complaints. They're usually fairly quick and inexpensive. However, if you have serious medical issues or chronic medical problems, these are probably not your best option.  No Primary Care Doctor: - Call Health Connect at  [phone] - they can help you locate a primary care doctor that  accepts your insurance, provides certain services, etc. - Physician Referral Service- [phone]  Chronic Pain Problems: Organization         Address  Phone   Notes  [facility]  [phone] Patients need to be referred by their primary care doctor.   Medication Assistance: Organization         Address  Phone   Notes  [facility] Medication [facility] [address] [place], [place] [address] [phone] --Must be a resident of [facility] -- Must have NO insurance coverage whatsoever (no Medicaid/ Medicare, etc.) -- The pt. MUST have a primary care doctor that directs their care regularly and follows them in the community   MedAssist  [phone]   [employer]  [phone]    Agencies that provide inexpensive medical care: Organization         Address  Phone   Notes  [facility]  [phone]   [facility]    [phone]   [facility] [address] [place], [place] [address] [phone]   [facility] [address] [place], [place] [phone]   Planned Parenthood    [phone]   [facility]    [phone]   [facility]  [address], [place] Phone:  [phone], Fax:  [phone] Hours of Operation:  9 am - 6 pm, M-F.  Also accepts Medicaid/Medicare and self-pay.  [facility]  [address],  Phone: [phone], Fax: [phone]. Hours of Operation:  8:30 am - 5:30 pm, M-F.  Also accepts Medicaid and self-pay.  [facility] [place] [address], [place] Phone: [phone]   [facility] [address] Natasha Bence [place], [place] [phone], Ext. [phone] Mondays & Thursdays: 7-9 AM.  First 15  patients are seen on a first come, first serve basis.    Medicaid-accepting [facility] Providers:  Organization         Address  Phone   Notes  [facility] [address], [place] [phone] Also accepts self-pay patients.  [facility] [address], [place]  [phone]   [facility] [address], [place] [phone]   [facility] [address] [place] [address], [place] [phone]) [phone]   Renaye RakersVeita Bland [address], [place]   [phone]) [phone] Only accepts [place] Access [place] patients after they have their name applied to their card.   Self-Pay (no insurance) in [facility] [place]:  Organization         Address  Phone   Notes  Sickle Cell Patients, [facility] [address], [place] [phone]) [phone]   [facility] [address], [place] [phone]   [facility]  [address], [place] [phone]   [facility]/Dr. Osei-Bonsu  [address] [place] [address] [place] [address], [place] or [phone], [place] [phone] Phone number for both [place] and [place] locations is the same.  [facility] [address], [place] [phone]   [facility] [place] [address] [place] [address], [place] or [address] [phone]   [facility] [address], [place] [phone], phone; [phone]) [phone], fax Sees patients 1st and 3rd Saturday of every month.  Must not qualify for public or private insurance (i.e.  Medicaid, Medicare, [place] Health Choice, Veterans' Benefits)  Household income should be no more than 200% of the poverty level The clinic cannot treat you if you are pregnant or think you are pregnant  Sexually transmitted diseases are not treated at the clinic.    Dental Care: Organization         Address  Phone  Notes  [facility] Department of [facility] [address], [place] [phone]) [phone] Accepts children up to age 29 who are enrolled in [place] or [place] Health Choice; pregnant women with a Medicaid card; and children who have applied for Medicaid or [place] Health Choice, but were declined, whose parents can pay a reduced fee at time of service.  [facility] of [facility] [place]  [address], [place] [phone]) [phone] Accepts children up to age 29 who are enrolled in [place] or [place] Health Choice; pregnant women with a Medicaid card; and children who have applied for Medicaid or [place] Health Choice, but were declined, whose parents can pay a reduced fee at time of service.  [facility]  [address], [place] [phone] Patients are seen by appointment only. Walk-ins are not accepted. [facility] will see patients 29 years of age and older. Monday - Tuesday (8am-5pm) Most Wednesdays (8:30-5pm) $30 per visit, cash only  [facility]  [address], [facility] [place] [phone] Patients are seen by appointment only. Walk-ins are not accepted. [facility] will see patients 29 years of age and older. One Wednesday Evening (Monthly: Volunteer Based).  $30 per visit, cash only  [employer] [facility]  [phone] for adults; Children under age 304, call Graduate Pediatric Dentistry at (  [phone]. Children aged 1-14, please call [phone] to request a pediatric application.  Dental services are provided in all areas of dental care including fillings,  crowns and bridges, complete and partial dentures, implants, gum treatment, root canals, and extractions. Preventive care is also provided. Treatment is provided to both adults and children. Patients are selected via a lottery and there is often a waiting list.   [facility] [address] [place]  [phone] [email or web address]   [facility] [address] [place], [place] [phone], Ext. [phone] Second and Fourth Thursday of each month, opens at 6:30 AM; Clinic ends at 9 AM.  Patients are seen on a first-come first-served basis, and a limited number are seen during each clinic.   [facility]  [address] Ether Griffins [place], [place] [phone]   Eligibility Requirements You must have lived in [place], [place], or [place] counties for at least the last three months.   You cannot be eligible for state or federal sponsored [place], including [employer], [place], or [employer].   You generally cannot be eligible for healthcare insurance through your employer.    How to apply: Eligibility screenings are held every Tuesday and Wednesday afternoon from 1:00 pm until 4:00 pm. You do not need an appointment for the interview!  [facility] [address] [place], [place] [phone]   [facility] Health Department  [phone]   [facility] Health Department  [phone]   [facility] Health Department  [phone]    Behavioral Health Resources in the Community: Intensive Outpatient Programs Organization         Address  Phone  Notes  [facility] [address], [place], [place] [phone]   [facility] Outpatient [address], [place], [place] [phone]   ADS: Alcohol & Drug Svcs [address] [place], [place]  [phone]   [facility] Mental Health [address]  [place], [place] [phone] or [phone]   Substance Abuse  Resources Organization         Address  Phone  Notes  Alcohol and Drug Services  [phone]   [facility]  [phone]   [facility]  [phone]   Floydene Flock  [phone]   Residential & Outpatient Substance Abuse Program  [phone]   Psychological Services Organization         Address  Phone  Notes  [facility] Health  [phone]   [facility] [place] Services  [phone]   [facility] Mental Health [address] [place] [phone] or [phone]    Mobile Crisis Teams Organization         Address  Phone  Notes  [facility], Mobile Crisis Care Unit  [phone]   [facility]  [address] [place], [place] [phone]   Doristine Locks [address] [place] [phone]    Self-Help/Support Groups Organization         Address  Phone             Notes  [facility] - variety of support groups  [phone]- [phone] Call for more information  Narcotics Anonymous (NA), [facility] [address], [employer] [place]  2 meetings at this location   Chief Executive Officer  Notes  [facility] [address],    [place]  [phone]   [facility]  [address] [place] [address], [place], [place] [phone]   [facility] [address] [place], [place] [phone] Admissions: 8am-3pm M-F  [facility] [address],    [place], [place] [phone]   [facility] [address] [place], [place], [place] [phone]   [facility] [address]  [place], [place] [phone]   [facility] - Intensive Outpatient [address], [place], [place] [phone]   [facility] ([facility] [address] [place]  [place], [place] [phone] or [phone]   [facility] [address] [place], [place] [phone] Accepts Medicaid  [facility] [address]  [place] [phone] Substance Abuse/Addiction Treatment   [facility] Organization         Address  Phone  Notes  [facility]  [phone]   Angie Fava, PhD [address] [place] [place], [place]   [phone] or [phone]   [facility]   [address] [place], [place] [phone]   [facility] [address] [place], [place] [phone] Insurance/Medicaid/sponsorship through [facility] [address]                                    [place], [place] [phone] Therapy/tele-psych/case  [facility] [address] [place], [place] [phone]    Dr. Lolly Mustache  [phone]   [facility]  [employer] [facility] Dept. 1) [address], [place] 2) [address] [place] 3)  [address], [place] [phone]  [phone]   [facility] Child Abuse Hotline [phone] or [phone] (After Hours)

## 2014-12-24 NOTE — ED Provider Notes (Signed)
CSN: [id number]     Arrival date & time 12/24/14  0744 History   First MD Initiated Contact with Patient 12/24/14 0757     Chief Complaint  Patient presents with  . Panic Attack     (Consider location/radiation/quality/duration/timing/severity/associated sxs/prior Treatment) HPI  Pt p/w feeling overwhelmed, depressed, anxious.  She has multiple social and familial problems, lack of job and resources, no support system.  States this has been going on since November, but has been worse in the past month.  When she feels overwhelmed she feels more of her chronic sciatic pain and arthralgias, and feels SOB and palpitations.  Palpitations described as "like something being ripped out of my chest."  She has also had episodes of slow heart rate and feeling dizzy since she was a child, was evaluated as a teenager but not since. States she does occasionally have syncope if she is not able to lie down or sit down fast enough.  The last episode of this was over 1 month ago.  She has thoughts of suicide and states "sometimes I wish I didn't exist."  But states she is too scared and would never try to kill herself because "with my luck I would just end up a vegetable."   Past Medical History  Diagnosis Date  . Smoker   . Anemia   . Other and unspecified ovarian cysts     Hx of   . History of cocaine abuse     opioid   Past Surgical History  Procedure Laterality Date  . Tonsillectomy    . Cesarean section    . Oophorectomy Right     mucinous cystadenoma  . Open reduction internal fixation (orif) distal radial fracture Right 09/17/2012    Procedure: OPEN REDUCTION INTERNAL FIXATION (ORIF) DISTAL RADIAL FRACTURE;  Surgeon: Budd Palmer, MD;  [facility];  Service: Orthopedics;  Laterality: Right;  . Orif ankle fracture Right 09/17/2012    Procedure: OPEN REDUCTION INTERNAL FIXATION (ORIF) ANKLE FRACTURE;  Surgeon: Budd Palmer, MD;  [facility]: [facility] OR;  Service: Orthopedics;  Laterality: Right;   . Orif zygomatic fracture Right 09/22/2012    Procedure: OPEN REDUCTION INTERNAL FIXATION (ORIF) ZYGOMATIC FRACTURE ORBITAL FLOOR EXPLORATION WITH FROST STITCH ;  Surgeon: Flo Shanks, MD;  [facility];  Service: ENT;  Laterality: Right;  Repair of lip laceration  . Cesarean section N/A 05/17/2013    Procedure: REPEAT CESAREAN SECTION;  Surgeon: [name], MD;  Location: [facility] ORS;  Service: Obstetrics;  Laterality: N/A;  . Eye surgery     Family History  Problem Relation Age of Onset  . Cancer Paternal Grandmother     breast  . Hypertension Other   . Diabetes Other   . Coronary artery disease Other   . Cancer Father     prostate and esophageal   History  Substance Use Topics  . Smoking status: Current Every Day Smoker -- 1.00 packs/day for 10 years    Types: Cigarettes  . Smokeless tobacco: Never Used  . Alcohol Use: No     Comment: occasional; not now   OB History    Gravida Para Term Preterm AB TAB SAB Ectopic Multiple Living   Review of Systems  Musculoskeletal: Positive for back pain and arthralgias.  All other systems reviewed and are negative.     Allergies  Review of patient's allergies indicates no known allergies.  Home Medications   Prior to Admission medications   Medication Sig Start Date End Date Taking? Authorizing Provider  escitalopram (LEXAPRO) 10 MG tablet Take 1 tablet (10 mg total) by mouth daily. Patient not taking: Reported on 07/18/2014 06/21/13   Cheral Marker, CNM  etonogestrel (NEXPLANON) 68 MG IMPL implant Inject 1 each into the skin once.    Historical Provider, MD  ferrous sulfate 325 (65 FE) MG tablet Take 1 tablet (325 mg total) by mouth daily with breakfast. Patient not taking: Reported on 07/18/2014 04/26/13   Cheral Marker, CNM  hydrochlorothiazide (HYDRODIURIL) 25 MG tablet Take 1 tablet (25 mg total) by mouth daily. Patient not taking: Reported on 07/18/2014 05/24/13   [name], MD   HYDROcodone-acetaminophen (NORCO/VICODIN) 5-325 MG per tablet Take 1 tablet by mouth every 6 (six) hours as needed for pain. Patient not taking: Reported on 07/18/2014 05/24/13   [name], MD  ibuprofen (ADVIL,MOTRIN) 600 MG tablet Take 1 tablet (600 mg total) by mouth every 6 (six) hours. Patient not taking: Reported on 07/18/2014 05/19/13   Ryann Cowart, MD  ibuprofen (ADVIL,MOTRIN) 800 MG tablet Take 1 tablet (800 mg total) by mouth 3 (three) times daily. Patient not taking: Reported on 11/23/2014 07/18/14   Ivery Quale, PA-C  methocarbamol (ROBAXIN) 500 MG tablet Take 1 tablet (500 mg total) by mouth 3 (three) times daily. Patient not taking: Reported on 11/23/2014 07/18/14   Ivery Quale, PA-C  oxyCODONE-acetaminophen (PERCOCET/ROXICET) 5-325 MG per tablet Take 1-2 tablets by mouth every 4 (four) hours as needed. Patient not taking: Reported on 07/18/2014 05/19/13   Hal Neer, MD  Prenatal Vit-Fe Fumarate-FA (PRENATAL MULTIVITAMIN) TABS Take 1 tablet by mouth daily. Patient not taking: Reported on 07/18/2014 09/23/12   Freeman Caldron, PA-C   BP 130/96 mmHg  Pulse 107  Temp(Src) 98.1 F (36.7 C) (Oral)  Resp 18  Ht  (1.702 m)  Wt 150 lb (68.04 kg)  BMI 23.49 kg/m2  SpO2 97% Physical Exam  Constitutional: She appears well-developed and well-nourished. No distress.  HENT:  Head: Normocephalic and atraumatic.  Neck: Neck supple.  Cardiovascular: Normal rate and regular rhythm.   Pulmonary/Chest: Effort normal and breath sounds normal. No respiratory distress. She has no wheezes. She has no rales.  Abdominal: Soft. She exhibits no distension. There is no tenderness. There is no rebound and no guarding.  Neurological: She is alert.  Skin: She is not diaphoretic.  Psychiatric: Her speech is normal and behavior is normal. She exhibits a depressed mood. She expresses no suicidal plans.  tearful  Nursing note and vitals reviewed.   ED Course  Procedures (including  critical care time) Labs Review Labs Reviewed - No data to display  Imaging Review No results found.   EKG Interpretation   Date/Time:  Saturday December 24 2014 07:55:43 EDT Ventricular Rate:  108 PR Interval:  141 QRS Duration: 83 QT Interval:  348 QTC Calculation: 466 R Axis:   83 Text Interpretation:  Sinus tachycardia Borderline repolarization  abnormality Since last tracing rate faster Confirmed by WENTZ  MD, ELLIOTT  ([id number]) on 12/24/2014 8:15:31 AM        MDM   Final diagnoses:  Anxiety and depression    Afebrile, nontoxic patient with worsening depression and anxiety with difficult social and familial situation.  Various somatic complaints are all chronic.  TTS consulted - Discussed pt with Beatriz Stallion, TTS.  Pt does not meet inpatient criteria.  Will recommend outpatient  resources.  She has signed a no harm contract.   Pt notes chronic intermittent hx of sensation of bradycardia and lightheadedness/occasional syncope.  Has not been evaluated for this since she was a child.  Symptoms not present for over a month.  I have encouraged her to follow up with cardiology and to return for any recurrent symptoms.   D/C home with multiple resources. Discussed result, findings, treatment, and follow up  with patient.  Pt given return precautions.  Pt verbalizes understanding and agrees with plan.           Trixie Dredgemily Rhianne Soman, PA-C 12/24/14 1052  Mancel BaleElliott [place], MD 12/24/14 1352

## 2014-12-24 NOTE — ED Notes (Signed)
Pt states she has been having panic attacks and sleeping a lot. Pt states recent breakup which has aggravated her symptoms. Pt is tearful. Pt states she has palpitations and feels weak a lot as well.

## 2015-01-26 ENCOUNTER — Emergency Department (HOSPITAL_COMMUNITY)
Admission: EM | Admit: 2015-01-26 | Discharge: 2015-01-27 | Disposition: A | Discharge status: Other Acute Inpatient Hospital | Payer: Medicaid Other | Attending: Emergency Medicine | Admitting: Emergency Medicine

## 2015-01-26 ENCOUNTER — Encounter (HOSPITAL_COMMUNITY): Payer: Self-pay | Admitting: Emergency Medicine

## 2015-01-26 DIAGNOSIS — Z8742 Personal history of other diseases of the female genital tract: Secondary | ICD-10-CM | POA: Diagnosis not present

## 2015-01-26 DIAGNOSIS — Z3202 Encounter for pregnancy test, result negative: Secondary | ICD-10-CM | POA: Diagnosis not present

## 2015-01-26 DIAGNOSIS — D649 Anemia, unspecified: Secondary | ICD-10-CM | POA: Insufficient documentation

## 2015-01-26 DIAGNOSIS — R55 Syncope and collapse: Secondary | ICD-10-CM | POA: Insufficient documentation

## 2015-01-26 DIAGNOSIS — F1110 Opioid abuse, uncomplicated: Secondary | ICD-10-CM | POA: Diagnosis not present

## 2015-01-26 DIAGNOSIS — F419 Anxiety disorder, unspecified: Secondary | ICD-10-CM | POA: Insufficient documentation

## 2015-01-26 DIAGNOSIS — F329 Major depressive disorder, single episode, unspecified: Secondary | ICD-10-CM | POA: Insufficient documentation

## 2015-01-26 DIAGNOSIS — F1310 Sedative, hypnotic or anxiolytic abuse, uncomplicated: Secondary | ICD-10-CM | POA: Diagnosis not present

## 2015-01-26 DIAGNOSIS — F1410 Cocaine abuse, uncomplicated: Secondary | ICD-10-CM | POA: Insufficient documentation

## 2015-01-26 DIAGNOSIS — M545 Low back pain: Secondary | ICD-10-CM | POA: Diagnosis not present

## 2015-01-26 DIAGNOSIS — F32A Depression, unspecified: Secondary | ICD-10-CM

## 2015-01-26 DIAGNOSIS — Z79899 Other long term (current) drug therapy: Secondary | ICD-10-CM | POA: Insufficient documentation

## 2015-01-26 DIAGNOSIS — F1510 Other stimulant abuse, uncomplicated: Secondary | ICD-10-CM | POA: Insufficient documentation

## 2015-01-26 DIAGNOSIS — F1210 Cannabis abuse, uncomplicated: Secondary | ICD-10-CM | POA: Diagnosis not present

## 2015-01-26 DIAGNOSIS — Z046 Encounter for general psychiatric examination, requested by authority: Secondary | ICD-10-CM | POA: Diagnosis present

## 2015-01-26 DIAGNOSIS — Z720 Tobacco use: Secondary | ICD-10-CM | POA: Insufficient documentation
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
## 2015-01-27 ENCOUNTER — Inpatient Hospital Stay (HOSPITAL_COMMUNITY)
Admission: AD | Admit: 2015-01-27 | Discharge: 2015-01-30 | DRG: 897 | Disposition: A | Discharge status: Home / Self Care | Payer: Medicaid Other | Source: Intra-hospital | Attending: Psychiatry | Admitting: Psychiatry

## 2015-01-27 ENCOUNTER — Encounter (HOSPITAL_COMMUNITY): Payer: Self-pay | Admitting: *Deleted

## 2015-01-27 DIAGNOSIS — F419 Anxiety disorder, unspecified: Secondary | ICD-10-CM | POA: Diagnosis not present

## 2015-01-27 DIAGNOSIS — Z9114 Patient's other noncompliance with medication regimen: Secondary | ICD-10-CM | POA: Diagnosis present

## 2015-01-27 DIAGNOSIS — Z62820 Parent-biological child conflict: Secondary | ICD-10-CM | POA: Diagnosis present

## 2015-01-27 DIAGNOSIS — F1994 Other psychoactive substance use, unspecified with psychoactive substance-induced mood disorder: Principal | ICD-10-CM | POA: Diagnosis present

## 2015-01-27 DIAGNOSIS — F1914 Other psychoactive substance abuse with psychoactive substance-induced mood disorder: Secondary | ICD-10-CM | POA: Diagnosis present

## 2015-01-27 DIAGNOSIS — F1910 Other psychoactive substance abuse, uncomplicated: Secondary | ICD-10-CM | POA: Diagnosis present

## 2015-01-27 DIAGNOSIS — F17210 Nicotine dependence, cigarettes, uncomplicated: Secondary | ICD-10-CM | POA: Diagnosis present

## 2015-01-27 DIAGNOSIS — F1410 Cocaine abuse, uncomplicated: Secondary | ICD-10-CM | POA: Diagnosis present

## 2015-01-27 LAB — URINALYSIS, ROUTINE W REFLEX MICROSCOPIC
BILIRUBIN URINE: NEGATIVE
GLUCOSE, UA: NEGATIVE mg/dL
HGB URINE DIPSTICK: NEGATIVE
Ketones, ur: NEGATIVE mg/dL
LEUKOCYTES UA: NEGATIVE
Nitrite: NEGATIVE
PH: 6 (ref 5.0–8.0)
Protein, ur: NEGATIVE mg/dL
Specific Gravity, Urine: 1.01 (ref 1.005–1.030)
Urobilinogen, UA: 0.2 mg/dL (ref 0.0–1.0)

## 2015-01-27 LAB — COMPREHENSIVE METABOLIC PANEL
ALT: 11 U/L — ABNORMAL LOW (ref 14–54)
ANION GAP: 7 (ref 5–15)
AST: 16 U/L (ref 15–41)
Albumin: 4.4 g/dL (ref 3.5–5.0)
Alkaline Phosphatase: 51 U/L (ref 38–126)
BUN: 12 mg/dL (ref 6–20)
CALCIUM: 8.9 mg/dL (ref 8.9–10.3)
CO2: 29 mmol/L (ref 22–32)
Chloride: 104 mmol/L (ref 101–111)
Creatinine, Ser: 0.77 mg/dL (ref 0.44–1.00)
GFR calc non Af Amer: >60 mL/min (ref >60)
GLUCOSE: 109 mg/dL — AB (ref 65–99)
Potassium: 3.8 mmol/L (ref 3.5–5.1)
Sodium: 140 mmol/L (ref 135–145)
Total Bilirubin: 0.4 mg/dL (ref 0.3–1.2)
Total Protein: 7.3 g/dL (ref 6.5–8.1)

## 2015-01-27 LAB — CBC WITH DIFFERENTIAL/PLATELET
Basophils Absolute: 0 10*3/uL (ref 0.0–0.1)
Basophils Relative: 0 % (ref 0–1)
Eosinophils Absolute: 0.2 10*3/uL (ref 0.0–0.7)
Eosinophils Relative: 2 % (ref 0–5)
HCT: 41.1 % (ref 36.0–46.0)
Hemoglobin: 13.6 g/dL (ref 12.0–15.0)
LYMPHS PCT: 31 % (ref 12–46)
Lymphs Abs: 3.1 10*3/uL (ref 0.7–4.0)
MCH: 29.8 pg (ref 26.0–34.0)
MCHC: 33.1 g/dL (ref 30.0–36.0)
MCV: 90.1 fL (ref 78.0–100.0)
MONOS PCT: 6 % (ref 3–12)
Monocytes Absolute: 0.6 10*3/uL (ref 0.1–1.0)
NEUTROS ABS: 5.9 10*3/uL (ref 1.7–7.7)
NEUTROS PCT: 61 % (ref 43–77)
Platelets: 226 10*3/uL (ref 150–400)
RBC: 4.56 MIL/uL (ref 3.87–5.11)
RDW: 13.7 % (ref 11.5–15.5)
WBC: 9.9 10*3/uL (ref 4.0–10.5)

## 2015-01-27 LAB — RAPID URINE DRUG SCREEN, HOSP PERFORMED
Amphetamines: POSITIVE — AB
BARBITURATES: NOT DETECTED
Benzodiazepines: POSITIVE — AB
COCAINE: POSITIVE — AB
Opiates: POSITIVE — AB
Tetrahydrocannabinol: POSITIVE — AB

## 2015-01-27 LAB — ETHANOL: Alcohol, Ethyl (B): <5 mg/dL (ref <5)

## 2015-01-27 LAB — PREGNANCY, URINE: PREG TEST UR: NEGATIVE

## 2015-01-27 MED ORDER — CHLORDIAZEPOXIDE HCL 25 MG PO CAPS
25.0000 mg | ORAL_CAPSULE | Freq: Four times a day (QID) | ORAL | Status: AC | PRN
  Administered 2015-01-27 – 2015-01-29 (×3): 25 mg via ORAL
  Filled 2015-01-27 (×3): qty 1

## 2015-01-27 MED ORDER — HYDROXYZINE HCL 25 MG PO TABS
25.0000 mg | ORAL_TABLET | Freq: Four times a day (QID) | ORAL | Status: DC | PRN
Start: 2015-01-27 — End: 2015-01-30
  Administered 2015-01-27 – 2015-01-29 (×5): 25 mg via ORAL
  Filled 2015-01-27 (×6): qty 1
  Filled 2015-01-27: qty 14
  Filled 2015-01-27: qty 1

## 2015-01-27 MED ORDER — IBUPROFEN 400 MG PO TABS: 600.0000 mg | ORAL_TABLET | Freq: Three times a day (TID) | ORAL | Status: DC | PRN

## 2015-01-27 MED ORDER — LORAZEPAM 1 MG PO TABS
1.0000 mg | ORAL_TABLET | Freq: Once | ORAL | Status: AC
  Administered 2015-01-27: 1 mg via ORAL
  Filled 2015-01-27: qty 1

## 2015-01-27 MED ORDER — NICOTINE 14 MG/24HR TD PT24
14.0000 mg | MEDICATED_PATCH | Freq: Every day | TRANSDERMAL | Status: DC
  Administered 2015-01-27: 14 mg via TRANSDERMAL
  Filled 2015-01-27: qty 1

## 2015-01-27 MED ORDER — ACETAMINOPHEN 325 MG PO TABS
650.0000 mg | ORAL_TABLET | Freq: Four times a day (QID) | ORAL | Status: DC | PRN
  Administered 2015-01-28 – 2015-01-30 (×5): 650 mg via ORAL
  Filled 2015-01-27 (×5): qty 2

## 2015-01-27 MED ORDER — ALUM & MAG HYDROXIDE-SIMETH 200-200-20 MG/5ML PO SUSP: 30.0000 mL | ORAL | Status: DC | PRN

## 2015-01-27 MED ORDER — ACETAMINOPHEN 325 MG PO TABS
650.0000 mg | ORAL_TABLET | Freq: Once | ORAL | Status: AC
  Administered 2015-01-27: 650 mg via ORAL
  Filled 2015-01-27: qty 2

## 2015-01-27 MED ORDER — TRAZODONE HCL 50 MG PO TABS
50.0000 mg | ORAL_TABLET | Freq: Every evening | ORAL | Status: DC | PRN
  Administered 2015-01-28 – 2015-01-29 (×2): 50 mg via ORAL
  Filled 2015-01-27: qty 1
  Filled 2015-01-27: qty 14
  Filled 2015-01-27 (×2): qty 1

## 2015-01-27 MED ORDER — LORAZEPAM 1 MG PO TABS: 1.0000 mg | ORAL_TABLET | Freq: Two times a day (BID) | ORAL | Status: DC | PRN

## 2015-01-27 MED ORDER — MAGNESIUM HYDROXIDE 400 MG/5ML PO SUSP: 30.0000 mL | Freq: Every day | ORAL | Status: DC | PRN

## 2015-01-27 MED ORDER — ONDANSETRON HCL 4 MG PO TABS: 4.0000 mg | ORAL_TABLET | Freq: Three times a day (TID) | ORAL | Status: DC | PRN

## 2015-01-27 NOTE — [facility] Counselor (Signed)
Disposition: Per Hulan FessIjeoma Nwaeze, NP, pt meets inpt tx criteria. Delorise Jacksonori[facility]. UDS and pregnancy drug screen still needed for potential admission.  TTS Counselor informed Wilkie AyeKristy, RN at [facility] of need for UDS. She reported that they have attempted to get sample twice and pt was uncooperative. RN stated that she would let charge nurse know of disposition.   Cyndie MullAnna Alaiyah Bollman, [facility] Triage Specialist

## 2015-01-27 NOTE — [facility] Group Notes (Deleted)
[facility] Note   01/27/2015 8:50 AM  Participation Quality:  Did not attend; patient newly admitted   [name], [name]
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
## 2015-01-27 NOTE — Progress Notes (Addendum)
Pt was invited to attend the evening AA group. Pt yelled, "I am not an alcoholic, dammit." at this writer and stayed in her room.
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
## 2015-01-27 NOTE — Tx Team (Addendum)
Initial Interdisciplinary Treatment Plan   PATIENT STRESSORS: Financial difficulties Marital or family conflict Traumatic event   PATIENT STRENGTHS: Ability for insight Capable of independent living Wellsite geologistCommunication skills General fund of knowledge Motivation for treatment/growth   PROBLEM LIST: Problem List/Patient Goals Date to be addressed Date deferred Reason deferred Estimated date of resolution  Polysubstance Abuse      Substance induced mood d/o                                                 DISCHARGE CRITERIA:  Ability to meet basic life and health needs Adequate post-discharge living arrangements Improved stabilization in mood, thinking, and/or behavior Motivation to continue treatment in a less acute level of care Need for constant or close observation no longer present Reduction of life-threatening or endangering symptoms to within safe limits Verbal commitment to aftercare and medication compliance Withdrawal symptoms are absent or subacute and managed without 24-hour nursing intervention  PRELIMINARY DISCHARGE PLAN: Attend 12-step recovery group Participate in family therapy  PATIENT/FAMIILY INVOLVEMENT: This treatment plan has been presented to and reviewed with the patient, Verdene LennertValissa J XXXKarlik.  The patient and family have been given the opportunity to ask questions and make suggestions.  Areatha Keasraxler, [name] 01/27/2015, 6:02 PM

## 2015-01-27 NOTE — ED Provider Notes (Signed)
[facility] Pt informed that IVC is in place   Zadie Rhineonald Bonnetta Allbee, MD 01/27/15 [id number]

## 2015-01-28 MED ORDER — CLONAZEPAM 0.5 MG PO TABS
ORAL_TABLET | ORAL | Status: AC
  Filled 2015-01-28: qty 1

## 2015-01-28 MED ORDER — NICOTINE 21 MG/24HR TD PT24
MEDICATED_PATCH | TRANSDERMAL | Status: AC
  Administered 2015-01-28: 13:00:00
  Filled 2015-01-28: qty 1

## 2015-01-28 MED ORDER — CLONAZEPAM 0.5 MG PO TABS
0.2500 mg | ORAL_TABLET | Freq: Two times a day (BID) | ORAL | Status: AC
  Administered 2015-01-28 – 2015-01-29 (×2): 0.25 mg via ORAL
  Filled 2015-01-28: qty 1

## 2015-01-28 MED ORDER — GABAPENTIN 300 MG PO CAPS
300.0000 mg | ORAL_CAPSULE | Freq: Three times a day (TID) | ORAL | Status: DC
  Administered 2015-01-28 – 2015-01-30 (×6): 300 mg via ORAL
  Filled 2015-01-28: qty 1
  Filled 2015-01-28: qty 42
  Filled 2015-01-28: qty 1
  Filled 2015-01-28: qty 42
  Filled 2015-01-28: qty 1
  Filled 2015-01-28: qty 42
  Filled 2015-01-28 (×4): qty 1

## 2015-01-28 NOTE — [facility] Group Notes (Signed)
[facility] Group Notes: (Clinical Social Work)   01/28/2015      Type of Therapy:  Group Therapy   Participation Level:  Did Not Attend despite MHT prompting   Ambrose MantleMareida Grossman-Orr, LCSW 01/28/2015, 12:02 PM

## 2015-01-28 NOTE — Progress Notes (Signed)
D: Patient seen on day room playing game and interacting with peers. Patient pleasant and cheerful. Denies pain, SI, AH/VH at this time. Patient stated " I feel better. I would like to get sleeping pills tonight". No new complaint.  A: Patient encouraged to continue with the treatment plan and verbalize needs to staff. Due medications given as ordered. Every 15 minutes for safety maintained. Will continue to monitor patient for safety and stability. R: Patient remain safe.

## 2015-01-28 NOTE — Progress Notes (Signed)
[facility] Group Notes:  (Nursing/MHT/Case Management/Adjunct)  Date:  01/28/2015  Time:  2100 Type of Therapy:  wrap up group  Participation Level:  Active  Participation Quality:  Appropriate, Attentive, Sharing and Supportive  Affect:  Appropriate  Cognitive:  Appropriate  Insight:  Improving  Engagement in Group:  Engaged  Modes of Intervention:  Clarification, Education and Support  Summary of Progress/Problems:  Shelah LewandowskySquires, Dee Maday Carol 01/28/2015, 10:46 PM

## 2015-01-28 NOTE — [facility] Group Notes (Signed)
[facility] Group Notes:  (Nursing/MHT/Case Management/Adjunct)  Date:  01/28/2015  Time:  9:54 AM  Type of Therapy:  Nurse Education  Participation Level:  Did Not Attend  Participation Quality:    Affect:    Cognitive:    Insight:    Engagement in Group:    Modes of Intervention:    Summary of Progress/Problems:  Rich BraveDuke, Mandee Pluta Lynn 01/28/2015, 9:54 AM
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
## 2015-01-28 NOTE — Progress Notes (Signed)
Patient seen lying in bed awake. Appear anxious and easily irritated. Refused to respond to any question asked by this Clinical research associatewriter. Every 15 minutes check for safety maintained. Will continue to monitor for safety and stability.

## 2015-01-29 ENCOUNTER — Encounter (HOSPITAL_COMMUNITY): Payer: Self-pay | Admitting: Registered Nurse

## 2015-01-29 MED ORDER — NICOTINE 21 MG/24HR TD PT24
21.0000 mg | MEDICATED_PATCH | Freq: Every day | TRANSDERMAL | Status: DC
  Administered 2015-01-29 – 2015-01-30 (×2): 21 mg via TRANSDERMAL
  Filled 2015-01-29: qty 14
  Filled 2015-01-29 (×3): qty 1

## 2015-01-29 MED ORDER — NICOTINE 21 MG/24HR TD PT24
MEDICATED_PATCH | TRANSDERMAL | Status: AC
  Filled 2015-01-29: qty 1

## 2015-01-29 NOTE — Progress Notes (Signed)
Patient did attend the evening speaker AA meeting.  

## 2015-01-29 NOTE — Progress Notes (Signed)
D) Pt has been attending the group and interacting with her peers in the dayroom. Affect and mood are much improved from yesterday. Pt able to admit that she has a problem and needs to start working on her issues. Rates her depression at an 8, her hopelessness at a 5 and her anxiety at an 8. Denies SI and HI. Not verbalizing any anger today. Much more accepting of her situation. Denies she ever said she wanted to die. A) Pt given support, reassurance and praise along with encouragement. Provided with a 1:1. Therapeutic listening provided. R) Pt denies SI and HI.

## 2015-01-29 NOTE — [facility] Group Notes (Signed)
[facility] Group Notes:  (Clinical Social Work)  01/29/2015  10:00-11:00AM  Summary of Progress/Problems:   The main focus of today's process group was to   1)  discuss the importance of adding supports  2)  define health supports versus unhealthy supports  3)  identify the patient's current unhealthy supports and plan how to handle them  4)  Identify the patient's current healthy supports and plan what to add.  An emphasis was placed on using counselor, doctor, therapy groups, 12-step groups, and problem-specific support groups to expand supports.    The patient expressed resistance to adding supports because she has tried such in the past and was told "nothing is wrong with you" by a counselor at [facility].  She is angry at being in the hospital, is angry that she does not keep getting the same workers and therefore is having to repeat herself.  She is angry that she has not yet met with her social worker, and when CSW was able from the handoff to tell her that she refused to talk to her social worker on Friday, she was able to acknowledge that to be the case.  Type of Therapy:  Process Group with Motivational Interviewing  Participation Level:  Active  Participation Quality:  Attentive, Resistant and Sharing  Affect:  Blunted and Irritable  Cognitive:  Alert  Insight:  Limited  Engagement in Therapy:  Engaged  Modes of Intervention:   Education, Support and Processing, Activity  Selmer Dominion, LCSW 01/29/2015
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
## 2015-01-30 MED ORDER — TRAZODONE HCL 50 MG PO TABS: 50.0000 mg | ORAL_TABLET | Freq: Every evening | ORAL | Status: DC | PRN

## 2015-01-30 MED ORDER — HYDROXYZINE HCL 25 MG PO TABS: 25.0000 mg | ORAL_TABLET | Freq: Four times a day (QID) | ORAL | Status: DC | PRN

## 2015-01-30 MED ORDER — GABAPENTIN 300 MG PO CAPS: 300.0000 mg | ORAL_CAPSULE | Freq: Three times a day (TID) | ORAL | Status: DC

## 2015-01-30 MED ORDER — NICOTINE 21 MG/24HR TD PT24: 21.0000 mg | MEDICATED_PATCH | Freq: Every day | TRANSDERMAL | Status: DC

## 2015-01-30 NOTE — [facility] Suicide Risk Assessment (Signed)
[facility] INPATIENT:  Family/Significant Other Suicide Prevention Education  Suicide Prevention Education:  Patient Refusal for Family/Significant Other Suicide Prevention Education: The patient [name] has refused to provide written consent for family/significant other to be provided Family/Significant Other Suicide Prevention Education during admission and/or prior to discharge.  Physician notified. SPE reviewed with patient and brochure provided. Patient encouraged to return to hospital if having suicidal thoughts, patient verbalized his/her understanding and has no further questions at this time.   [name], [place] CarboKristin L 01/30/2015, 9:44 AM

## 2015-01-30 NOTE — Progress Notes (Signed)
D: Patient's mood very much improved. Seen talking and interacting with peers. Patient reports feeling sad after talking to her mom today. Stated "My mom just messed up my day. I couldn't have talked to her". Patient tearful when talking to this Clinical research associatewriter. Denies pain, SI, AH/VH at this time. Patient rates her anxiety 7/10 and stated that it would have been 0/10 if she didn't speak with the mother.  A: Patient was reassured. Offered support and encouragement. Due medications given a ordered. Every 15 minutes check for safety maintained. Will continue to monitor patient for safety and stability.  R: Patient is safe.
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
## 2015-01-30 NOTE — Progress Notes (Signed)
[facility] D/C Note:pt denies si/hi at this time. States that she will comply with aftercare appts and take her meds as prescribed. D/C to home with her friend providing transportation at 14:00 this afternoon.

## 2015-01-30 NOTE — Progress Notes (Signed)
  [facility] :  Will you be returning to the same living situation after discharge:  Yes,  Patient plans to return home At discharge, do you have transportation home?: Yes,  patient reports access to transportation Do you have the ability to pay for your medications: Yes,  patient will be provided with any necessary medication samples and prescriptions at discharge  Release of information consent forms completed and in the chart;  Patient's signature needed at discharge.  Patient to Follow up at: Follow-up Information    [facility]   Why:  Hospital follow up appointment for therapy and medication management services on Wednesday July 13th at 11 am. Please call office if you need to reschedule.   Contact information:   [address] [place], [place] [address] Phone: [phone]      Patient denies SI/HI: Yes,  denies    Safety Planning and Suicide Prevention discussed: Yes,  with patient  Have you used any form of tobacco in the last 30 days? (Cigarettes, Smokeless Tobacco, Cigars, and/or Pipes): Yes  Has patient been referred to the Quitline?: Patient refused referral  Glennis Montenegro, West CarboKristin L 01/30/2015, 11:28 AM

## 2015-01-30 NOTE — BHH Group Notes (Signed)
   [facility] Note  01/30/2015  8:45 AM   Participation Quality: Alert, Appropriate and Oriented  Mood/Affect: Appropriate  Depression Rating: 0  Anxiety Rating: 7 (reports that she experiences moderate anxiety at baseline)  Thoughts of Suicide: Pt denies SI/HI  Will you contract for safety? Yes  Current AVH: Pt denies  Plan for Discharge/Comments: Pt attended discharge planning group and actively participated in group. CSW provided pt with today's workbook. Patient reports feeling "good" today and is hopeful to discharge today. She would like a referral for outpatient services at discharge.  Transportation Means: Pt reports access to transportation  Supports: No supports mentioned at this time  Samuella BruinKristin Demetric Dunnaway, MSW, [employer] IncLCSWA Clinical Social Worker [facility] [phone]

## 2015-01-30 NOTE — Progress Notes (Signed)
Recreation Therapy Notes  Date: 07.11.16 Time: 9:30 am Location: [address] Group Room  Group Topic: Stress Management  Goal Area(s) Addresses:  Patient will verbalize importance of using healthy stress management.  Patient will identify positive emotions associated with healthy stress management.   Behavioral Response:  Engaged  Intervention: Stress Management  Activity :  Progressive Muscle Relaxation.  LRT introduced and educated patients on stress management technique of progressive muscle relaxation.  A script was used to deliver both techniques to patients.  Patients were asked to follow the script read a loud by the LRT to engage in practicing the technique.  Education:  Stress Management, Discharge Planning.   Education Outcome: Acknowledges edcuation/In group clarification offered/Needs additional education  Clinical Observations/Feedback: Patient attended group.    Caroll RancherMarjette Luverta Korte, LRT/CTRS  Caroll RancherLindsay, Tymira Horkey A 01/30/2015 3:49 PM

## 2015-08-24 ENCOUNTER — Ambulatory Visit (INDEPENDENT_AMBULATORY_CARE_PROVIDER_SITE_OTHER): Payer: Medicaid Other | Admitting: Family Medicine

## 2015-08-24 ENCOUNTER — Encounter: Payer: Self-pay | Admitting: Family Medicine

## 2015-08-24 VITALS — BP 108/71 | HR 88 | Temp 97.2°F | Ht 67.0 in | Wt 178.6 lb

## 2015-08-24 DIAGNOSIS — F1910 Other psychoactive substance abuse, uncomplicated: Secondary | ICD-10-CM

## 2015-08-24 DIAGNOSIS — G894 Chronic pain syndrome: Secondary | ICD-10-CM

## 2015-08-24 DIAGNOSIS — Z0000 Encounter for general adult medical examination without abnormal findings: Secondary | ICD-10-CM | POA: Diagnosis not present

## 2015-08-24 DIAGNOSIS — M5430 Sciatica, unspecified side: Secondary | ICD-10-CM | POA: Insufficient documentation

## 2015-08-24 DIAGNOSIS — M5432 Sciatica, left side: Secondary | ICD-10-CM
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
## 2015-08-24 NOTE — Patient Instructions (Signed)
Great to meet you!  Lets plan to see you in 1 year for another physical.   Try to get Dr. Juel Burrow to fill out your paperwork.   We will work on a neurology appointment for you.

## 2015-08-31 LAB — TOXASSURE SELECT 13 (MW), URINE: PDF: 0

## 2016-01-09 ENCOUNTER — Encounter (HOSPITAL_COMMUNITY): Payer: Self-pay

## 2016-01-09 ENCOUNTER — Inpatient Hospital Stay (HOSPITAL_COMMUNITY)
Admission: EM | Admit: 2016-01-09 | Discharge: 2016-01-11 | DRG: 603 | Disposition: A | Discharge status: Home / Self Care | Payer: Medicaid Other | Attending: Internal Medicine | Admitting: Internal Medicine

## 2016-01-09 DIAGNOSIS — D649 Anemia, unspecified: Secondary | ICD-10-CM | POA: Diagnosis present

## 2016-01-09 DIAGNOSIS — L02419 Cutaneous abscess of limb, unspecified: Secondary | ICD-10-CM | POA: Diagnosis present

## 2016-01-09 DIAGNOSIS — L02416 Cutaneous abscess of left lower limb: Principal | ICD-10-CM | POA: Diagnosis present

## 2016-01-09 DIAGNOSIS — Z8249 Family history of ischemic heart disease and other diseases of the circulatory system: Secondary | ICD-10-CM

## 2016-01-09 DIAGNOSIS — F17200 Nicotine dependence, unspecified, uncomplicated: Secondary | ICD-10-CM | POA: Diagnosis present

## 2016-01-09 DIAGNOSIS — L03119 Cellulitis of unspecified part of limb: Secondary | ICD-10-CM

## 2016-01-09 DIAGNOSIS — F1410 Cocaine abuse, uncomplicated: Secondary | ICD-10-CM | POA: Diagnosis present

## 2016-01-09 DIAGNOSIS — F17210 Nicotine dependence, cigarettes, uncomplicated: Secondary | ICD-10-CM | POA: Diagnosis present

## 2016-01-09 DIAGNOSIS — L03116 Cellulitis of left lower limb: Secondary | ICD-10-CM | POA: Diagnosis present

## 2016-01-09 DIAGNOSIS — F1910 Other psychoactive substance abuse, uncomplicated: Secondary | ICD-10-CM | POA: Diagnosis present

## 2016-01-09 DIAGNOSIS — R011 Cardiac murmur, unspecified: Secondary | ICD-10-CM

## 2016-01-09 LAB — URINALYSIS, ROUTINE W REFLEX MICROSCOPIC
Bilirubin Urine: NEGATIVE
Glucose, UA: NEGATIVE mg/dL
Hgb urine dipstick: NEGATIVE
Ketones, ur: NEGATIVE mg/dL
Leukocytes, UA: NEGATIVE
Nitrite: NEGATIVE
Protein, ur: NEGATIVE mg/dL
Specific Gravity, Urine: 1.008 (ref 1.005–1.030)
pH: 7.5 (ref 5.0–8.0)

## 2016-01-09 LAB — CBC WITH DIFFERENTIAL/PLATELET
Basophils Absolute: 0 K/uL (ref 0.0–0.1)
Basophils Relative: 0 %
Eosinophils Absolute: 0.1 K/uL (ref 0.0–0.7)
Eosinophils Relative: 1 %
HCT: 36.9 % (ref 36.0–46.0)
Hemoglobin: 12.3 g/dL (ref 12.0–15.0)
Lymphocytes Relative: 9 %
Lymphs Abs: 1 K/uL (ref 0.7–4.0)
MCH: 29.6 pg (ref 26.0–34.0)
MCHC: 33.3 g/dL (ref 30.0–36.0)
MCV: 88.9 fL (ref 78.0–100.0)
Monocytes Absolute: 0.8 K/uL (ref 0.1–1.0)
Monocytes Relative: 8 %
Neutro Abs: 8.3 K/uL — ABNORMAL HIGH (ref 1.7–7.7)
Neutrophils Relative %: 82 %
Platelets: 207 K/uL (ref 150–400)
RBC: 4.15 MIL/uL (ref 3.87–5.11)
RDW: 13 % (ref 11.5–15.5)
WBC: 10.2 K/uL (ref 4.0–10.5)

## 2016-01-09 LAB — COMPREHENSIVE METABOLIC PANEL WITH GFR
ALT: 10 U/L — ABNORMAL LOW (ref 14–54)
AST: 14 U/L — ABNORMAL LOW (ref 15–41)
Albumin: 3.7 g/dL (ref 3.5–5.0)
Alkaline Phosphatase: 55 U/L (ref 38–126)
Anion gap: 6 (ref 5–15)
BUN: 7 mg/dL (ref 6–20)
CO2: 26 mmol/L (ref 22–32)
Calcium: 8.5 mg/dL — ABNORMAL LOW (ref 8.9–10.3)
Chloride: 106 mmol/L (ref 101–111)
Creatinine, Ser: 0.63 mg/dL (ref 0.44–1.00)
GFR calc Af Amer: >60 mL/min
GFR calc non Af Amer: >60 mL/min
Glucose, Bld: 98 mg/dL (ref 65–99)
Potassium: 3.8 mmol/L (ref 3.5–5.1)
Sodium: 138 mmol/L (ref 135–145)
Total Bilirubin: 0.4 mg/dL (ref 0.3–1.2)
Total Protein: 6.9 g/dL (ref 6.5–8.1)

## 2016-01-09 LAB — I-STAT CG4 LACTIC ACID, ED: Lactic Acid, Venous: 1.06 mmol/L (ref 0.5–2.0)

## 2016-01-09 MED ORDER — PIPERACILLIN-TAZOBACTAM 3.375 G IVPB
3.3750 g | Freq: Three times a day (TID) | INTRAVENOUS | Status: DC
Start: 2016-01-10 — End: 2016-01-10
  Administered 2016-01-10: 3.375 g via INTRAVENOUS
  Filled 2016-01-09 (×2): qty 50

## 2016-01-09 MED ORDER — VANCOMYCIN HCL IN DEXTROSE 1-5 GM/200ML-% IV SOLN
1000.0000 mg | Freq: Three times a day (TID) | INTRAVENOUS | Status: DC
  Administered 2016-01-10 – 2016-01-11 (×4): 1000 mg via INTRAVENOUS
  Filled 2016-01-09 (×5): qty 200

## 2016-01-09 MED ORDER — SODIUM CHLORIDE 0.9 % IV BOLUS (SEPSIS)
1000.0000 mL | Freq: Once | INTRAVENOUS | Status: AC
  Administered 2016-01-09: 1000 mL via INTRAVENOUS

## 2016-01-09 MED ORDER — MORPHINE SULFATE (PF) 4 MG/ML IV SOLN
4.0000 mg | Freq: Once | INTRAVENOUS | Status: AC
  Administered 2016-01-09: 4 mg via INTRAVENOUS
  Filled 2016-01-09: qty 1

## 2016-01-09 MED ORDER — VANCOMYCIN HCL IN DEXTROSE 1-5 GM/200ML-% IV SOLN
1000.0000 mg | Freq: Once | INTRAVENOUS | Status: AC
  Administered 2016-01-09: 1000 mg via INTRAVENOUS
  Filled 2016-01-09: qty 200

## 2016-01-09 MED ORDER — PIPERACILLIN-TAZOBACTAM 3.375 G IVPB 30 MIN
3.3750 g | Freq: Once | INTRAVENOUS | Status: AC
  Administered 2016-01-09: 3.375 g via INTRAVENOUS
  Filled 2016-01-09: qty 50

## 2016-01-09 MED ORDER — ACETAMINOPHEN 325 MG PO TABS
650.0000 mg | ORAL_TABLET | Freq: Once | ORAL | Status: AC
  Administered 2016-01-09: 650 mg via ORAL
  Filled 2016-01-09: qty 2

## 2016-01-09 MED ORDER — LIDOCAINE-EPINEPHRINE (PF) 1 %-1:200000 IJ SOLN
20.0000 mL | Freq: Once | INTRAMUSCULAR | Status: AC
  Administered 2016-01-09: 20 mL
  Filled 2016-01-09: qty 30

## 2016-01-09 NOTE — ED Notes (Signed)
Pt with fever of 103.  Pt with red/cellulits to leg from drug use.  Pt has had the fever for 3 days.  Wants help with addiction.
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
## 2016-01-09 NOTE — ED Notes (Signed)
Spoke with MD concerning vitals.  Per MD, hold labs at this time until seen by a provider

## 2016-01-09 NOTE — ED Provider Notes (Signed)
Medical screening examination/treatment/procedure(s) were conducted as a shared visit with non-physician practitioner(s) and myself.  I personally evaluated the patient during the encounter.  30 yo F w/ multiple large abscesses and cellulitis, the worse is on her left lower leg. On my exam, also has a heart murmur that she doesn't know about so likely needs worked up for endocarditis as well. Plan for admission on onbroad spectrum antibiotics and echo.    EMERGENCY DEPARTMENT US SOFT TISSUE INTERPRETATION "Study: Limited Soft Tissue Ultrasound"  INDICATIONS: Soft tissue infection Multiple views of the body part were obtained in real-time with a multi-frequency linear probe PERFORMED BY:  Myself and Midlevel IMAGES ARCHIVED?: Yes SIDE:Left BODY PART:Lower extremity FINDINGS: Abcess present and Cellulitis present INTERPRETATION:  Abcess present and Cellulitis present   CPT: Lower extremity [id number]  Marily MemosJason Danyella Mcginty, MD 01/10/16 1156
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
## 2016-01-09 NOTE — ED Notes (Signed)
hospitalist at bedside

## 2016-01-09 NOTE — Progress Notes (Signed)
Pharmacy Antibiotic Note  [name] is a 30 y.o. female with Hx IVDU admitted on 01/09/2016 with cellulitis and reported abscess at injection site worsening x1 week. Reports fevers to 103.8 at home. Pharmacy has been consulted for vancomycin and Zosyn dosing.  Plan:  Vancomycin 1000 mg IV now, then 1000 mg IV q8 hr; goal trough 15-20 mcg/mL given possible undrained abscess and high risk for MDRs.  Measure vancomycin trough levels at steady state as indicated.  Zosyn 3.375 g IV given once over 30 minutes, then every 8 hrs by 4-hr infusion     Temp (24hrs), Avg:99.9 F (37.7 C), Min:98.8 F (37.1 C), Max:101 F (38.3 C)  No results for input(s): WBC, CREATININE, LATICACIDVEN, VANCOTROUGH, VANCOPEAK, VANCORANDOM, GENTTROUGH, GENTPEAK, GENTRANDOM, TOBRATROUGH, TOBRAPEAK, TOBRARND, AMIKACINPEAK, AMIKACINTROU, AMIKACIN in the last 168 hours.  CrCl cannot be calculated (Unknown ideal weight.).    No Known Allergies  Antimicrobials this admission: Vancomycin 6/20 >>  Zosyn 6/20 >>   Dose adjustments this admission: ---  Microbiology results: 6/20 BCx: sent 6/20 UCx: needs collect    Thank you for allowing pharmacy to be a part of this patient's care.  Bernadene Personrew Breannah Kratt, PharmD, BCPS Pager: [phone] 01/09/2016, 7:57 PM

## 2016-01-09 NOTE — ED Notes (Signed)
Bed: WA23 Expected date:  Expected time:  Means of arrival:  Comments: Hold for hall c 

## 2016-01-09 NOTE — ED Notes (Signed)
Pt refused rectal temperature, but allowed this RN to take an oral temp

## 2016-01-10 ENCOUNTER — Encounter (HOSPITAL_COMMUNITY): Payer: Self-pay | Admitting: Internal Medicine

## 2016-01-10 ENCOUNTER — Other Ambulatory Visit: Payer: Self-pay

## 2016-01-10 ENCOUNTER — Observation Stay (HOSPITAL_BASED_OUTPATIENT_CLINIC_OR_DEPARTMENT_OTHER): Payer: Medicaid Other

## 2016-01-10 DIAGNOSIS — L02419 Cutaneous abscess of limb, unspecified: Secondary | ICD-10-CM

## 2016-01-10 DIAGNOSIS — M79605 Pain in left leg: Secondary | ICD-10-CM | POA: Diagnosis present

## 2016-01-10 DIAGNOSIS — L03116 Cellulitis of left lower limb: Secondary | ICD-10-CM | POA: Diagnosis present

## 2016-01-10 DIAGNOSIS — F1910 Other psychoactive substance abuse, uncomplicated: Secondary | ICD-10-CM

## 2016-01-10 DIAGNOSIS — R011 Cardiac murmur, unspecified: Secondary | ICD-10-CM

## 2016-01-10 DIAGNOSIS — F1410 Cocaine abuse, uncomplicated: Secondary | ICD-10-CM | POA: Diagnosis present

## 2016-01-10 DIAGNOSIS — Z8249 Family history of ischemic heart disease and other diseases of the circulatory system: Secondary | ICD-10-CM | POA: Diagnosis not present

## 2016-01-10 DIAGNOSIS — F17200 Nicotine dependence, unspecified, uncomplicated: Secondary | ICD-10-CM | POA: Diagnosis present

## 2016-01-10 DIAGNOSIS — L03119 Cellulitis of unspecified part of limb: Secondary | ICD-10-CM

## 2016-01-10 DIAGNOSIS — F17210 Nicotine dependence, cigarettes, uncomplicated: Secondary | ICD-10-CM | POA: Diagnosis present

## 2016-01-10 DIAGNOSIS — L02416 Cutaneous abscess of left lower limb: Secondary | ICD-10-CM | POA: Diagnosis present

## 2016-01-10 DIAGNOSIS — D649 Anemia, unspecified: Secondary | ICD-10-CM | POA: Diagnosis present

## 2016-01-10 LAB — BASIC METABOLIC PANEL
Anion gap: 6 (ref 5–15)
BUN: 6 mg/dL (ref 6–20)
CHLORIDE: 109 mmol/L (ref 101–111)
CO2: 23 mmol/L (ref 22–32)
CREATININE: 0.59 mg/dL (ref 0.44–1.00)
Calcium: 8.3 mg/dL — ABNORMAL LOW (ref 8.9–10.3)
GFR calc non Af Amer: >60 mL/min (ref >60)
GLUCOSE: 98 mg/dL (ref 65–99)
Potassium: 3.7 mmol/L (ref 3.5–5.1)
Sodium: 138 mmol/L (ref 135–145)

## 2016-01-10 LAB — ECHOCARDIOGRAM COMPLETE
E/e' ratio: 6.4
EWDT: 296 ms
FS: 34 % (ref 28–44)
Height: 67 in
IV/PV OW: 0.95
LA ID, A-P, ES: 30 mm
LA diam index: 1.68 cm/m2
LA vol index: 25 mL/m2
LA vol: 44.8 mL
LAVOLA4C: 51.7 mL
LEFT ATRIUM END SYS DIAM: 30 mm
LV E/e'average: 6.4
LV TDI E'MEDIAL: 12.9
LVEEMED: 6.4
LVELAT: 14.4 cm/s
LVOT area: 2.84 cm2
LVOTD: 19 mm
MV Dec: 296
MV pk E vel: 92.2 m/s
MVPG: 3 mmHg
MVPKAVEL: 58.7 m/s
PW: 10.8 mm — AB (ref 0.6–1.1)
TDI e' lateral: 14.4
Weight: 2400 oz

## 2016-01-10 LAB — RAPID URINE DRUG SCREEN, HOSP PERFORMED
Amphetamines: NOT DETECTED
Barbiturates: NOT DETECTED
Benzodiazepines: POSITIVE — AB
COCAINE: NOT DETECTED
OPIATES: NOT DETECTED
TETRAHYDROCANNABINOL: NOT DETECTED

## 2016-01-10 LAB — HIV ANTIBODY (ROUTINE TESTING W REFLEX): HIV SCREEN 4TH GENERATION: NONREACTIVE

## 2016-01-10 LAB — MRSA PCR SCREENING: MRSA by PCR: NEGATIVE

## 2016-01-10 MED ORDER — MORPHINE SULFATE (PF) 2 MG/ML IV SOLN
2.0000 mg | INTRAVENOUS | Status: DC | PRN
  Administered 2016-01-10: 2 mg via INTRAVENOUS
  Filled 2016-01-10: qty 1

## 2016-01-10 MED ORDER — ONDANSETRON HCL 4 MG PO TABS: 4.0000 mg | ORAL_TABLET | Freq: Four times a day (QID) | ORAL | Status: DC | PRN

## 2016-01-10 MED ORDER — ACETAMINOPHEN 500 MG PO TABS
500.0000 mg | ORAL_TABLET | Freq: Four times a day (QID) | ORAL | Status: DC | PRN
  Administered 2016-01-10: 500 mg via ORAL
  Filled 2016-01-10: qty 1

## 2016-01-10 MED ORDER — NICOTINE 14 MG/24HR TD PT24
14.0000 mg | MEDICATED_PATCH | Freq: Every day | TRANSDERMAL | Status: DC
  Administered 2016-01-10 – 2016-01-11 (×2): 14 mg via TRANSDERMAL
  Filled 2016-01-10 (×3): qty 1

## 2016-01-10 MED ORDER — DEXTROSE 5 % IV SOLN
1.0000 g | INTRAVENOUS | Status: DC
  Administered 2016-01-10 – 2016-01-11 (×2): 1 g via INTRAVENOUS
  Filled 2016-01-10 (×2): qty 10

## 2016-01-10 MED ORDER — ONDANSETRON HCL 4 MG/2ML IJ SOLN: 4.0000 mg | Freq: Four times a day (QID) | INTRAMUSCULAR | Status: DC | PRN

## 2016-01-10 MED ORDER — IBUPROFEN 200 MG PO TABS
600.0000 mg | ORAL_TABLET | Freq: Four times a day (QID) | ORAL | Status: DC | PRN
  Administered 2016-01-10 – 2016-01-11 (×2): 600 mg via ORAL
  Filled 2016-01-10 (×2): qty 3

## 2016-01-10 MED ORDER — DOCUSATE SODIUM 100 MG PO CAPS
100.0000 mg | ORAL_CAPSULE | Freq: Two times a day (BID) | ORAL | Status: DC
  Administered 2016-01-10 – 2016-01-11 (×3): 100 mg via ORAL
  Filled 2016-01-10 (×4): qty 1

## 2016-01-10 MED ORDER — SODIUM CHLORIDE 0.9% FLUSH
3.0000 mL | Freq: Two times a day (BID) | INTRAVENOUS | Status: DC
  Administered 2016-01-10 (×2): 3 mL via INTRAVENOUS

## 2016-01-10 MED ORDER — PANTOPRAZOLE SODIUM 40 MG PO TBEC
40.0000 mg | DELAYED_RELEASE_TABLET | Freq: Every day | ORAL | Status: DC
  Administered 2016-01-11: 40 mg via ORAL
  Filled 2016-01-10 (×2): qty 1

## 2016-01-10 MED ORDER — ENOXAPARIN SODIUM 40 MG/0.4ML ~~LOC~~ SOLN: 40.0000 mg | SUBCUTANEOUS | Status: DC

## 2016-01-10 NOTE — Progress Notes (Signed)
Assumed care of patient at this time. Patient is stable with no complaints at this time. Agree with previously documented assessment. Will continue to monitor patient.   

## 2016-01-10 NOTE — Progress Notes (Signed)
  Echocardiogram 2D Echocardiogram has been performed.  [name] 01/10/2016, 11:03 AM

## 2016-01-10 NOTE — Plan of Care (Signed)
Problem: Activity: Goal: Risk for activity intolerance will decrease Outcome: Progressing Encouraged pt to ambulate halls

## 2016-01-10 NOTE — Progress Notes (Signed)
[facility] PROGRESS NOTE    Progress Note  [name]  [id number] DOB: 07-19-1986 DOA: 01/09/2016 PCP: Kevin FentonSamuel Bradshaw, MD     Brief Narrative:   [name] is an 30 y.o. female with no significant past medical history that comes in for an abscess and fever.  Assessment/Plan:   Cellulitis and abscess of leg, except foot She has remained afebrile we'll continue IV vancomycin Deescalated Zosyn to Rocephin. Avoid narcotics for pain use Tylenol or ibuprofen. Discontinue telemetry  Polysubstance abuse Avoid narcotics, no signs of withdrawal.  Tobacco use disorder: Lifting patch   DVT prophylaxis: Ambulate she is low risk Family Communication:none Disposition Plan/Barrier to D/C: home in am Code Status:     Code Status Orders        Start     Ordered   01/10/16 0017  Full code   Continuous     01/10/16 0016    Code Status History    Date Active Date Inactive Code Status Order ID Comments User Context   01/27/2015 12:33 PM 01/30/2015  5:25 PM Full Code [id number]  Thermon LeylandLaura A Davis, NP Inpatient   01/27/2015  1:06 AM 01/27/2015  8:29 AM Full Code [id number]  Zadie Rhineonald Wickline, MD ED   05/17/2013  6:03 PM 05/19/2013  5:40 PM Full Code [id number]  Tilda BurrowJohn V Ferguson, MD Inpatient        IV Access:    Peripheral IV   Procedures and diagnostic studies:   No results found.   Medical Consultants:    None.  Anti-Infectives:   Vancomycin and Rocephin  Subjective:    [name] she relates her pain is controlled she desires no narcotics.  Objective:    Filed Vitals:   01/09/16 2242 01/09/16 2304 01/10/16 0009 01/10/16 0542  BP: 99/55  96/56 98/59  Pulse:  67 65 73  Temp:   98.2 F (36.8 C) 99.4 F (37.4 C)  TempSrc:   Oral Oral  Resp: 16 18 16 16   Height:   5\' 7"  (1.702 m)   Weight:      SpO2:  100% 100% 100%    Intake/Output Summary (Last 24 hours) at 01/10/16 0854 Last data filed at 01/10/16 0747  Gross per 24 hour  Intake    1210 ml  Output      0 ml  Net   1210 ml   Filed Weights   01/09/16 2038  Weight: 68.04 kg (150 lb)    Exam: General exam: In no acute distress. Respiratory system: Good air movement and clear to auscultation. Cardiovascular system: S1 & S2 heard, RRR. No JVD. Gastrointestinal system: Abdomen is nondistended, soft and nontender.  Central nervous system: Alert and oriented. No focal neurological deficits. Extremities: No pedal edema. Skin: She has an erythema with an open wound on the right lateral calf, which is not expanded beyond the demarcated area Psychiatry: Judgement and insight appear normal. Mood & affect appropriate.    Data Reviewed:    Labs: Basic Metabolic Panel:  Recent Labs Lab 01/09/16 2036 01/10/16 0431  NA 138 138  K 3.8 3.7  CL 106 109  CO2 26 23  GLUCOSE 98 98  BUN 7 6  CREATININE 0.63 0.59  CALCIUM 8.5* 8.3*   GFR Estimated Creatinine Clearance: 100 mL/min (by C-G formula based on Cr of 0.59). Liver Function Tests:  Recent Labs Lab 01/09/16 2036  AST 14*  ALT 10*  ALKPHOS 55  BILITOT 0.4  PROT 6.9  ALBUMIN 3.7  No results for input(s): LIPASE, AMYLASE in the last 168 hours. No results for input(s): AMMONIA in the last 168 hours. Coagulation profile No results for input(s): INR, PROTIME in the last 168 hours.  CBC:  Recent Labs Lab 01/09/16 2036  WBC 10.2  NEUTROABS 8.3*  HGB 12.3  HCT 36.9  MCV 88.9  PLT 207   Cardiac Enzymes: No results for input(s): CKTOTAL, CKMB, CKMBINDEX, TROPONINI in the last 168 hours. BNP (last 3 results) No results for input(s): PROBNP in the last 8760 hours. CBG: No results for input(s): GLUCAP in the last 168 hours. D-Dimer: No results for input(s): DDIMER in the last 72 hours. Hgb A1c: No results for input(s): HGBA1C in the last 72 hours. Lipid Profile: No results for input(s): CHOL, HDL, LDLCALC, TRIG, CHOLHDL, LDLDIRECT in the last 72 hours. Thyroid function studies: No results for  input(s): TSH, T4TOTAL, T3FREE, THYROIDAB in the last 72 hours.  Invalid input(s): FREET3 Anemia work up: No results for input(s): VITAMINB12, FOLATE, FERRITIN, TIBC, IRON, RETICCTPCT in the last 72 hours. Sepsis Labs:  Recent Labs Lab 01/09/16 2036 01/09/16 2047  WBC 10.2  --   LATICACIDVEN  --  1.06   Microbiology Recent Results (from the past 240 hour(s))  Blood Culture (routine x 2)     Status: None (Preliminary result)   Collection Time: 01/09/16  9:00 PM  Result Value Ref Range Status   Specimen Description BLOOD RIGHT ANTECUBITAL  Final   Special Requests   Final    BOTTLES DRAWN AEROBIC AND ANAEROBIC 5CC Performed at [facility]    Culture PENDING  Incomplete   Report Status PENDING  Incomplete  MRSA PCR Screening     Status: None   Collection Time: 01/10/16 12:57 AM  Result Value Ref Range Status   MRSA by PCR NEGATIVE NEGATIVE Final    Comment:        The GeneXpert MRSA Assay (FDA approved for NASAL specimens only), is one component of a comprehensive MRSA colonization surveillance program. It is not intended to diagnose MRSA infection nor to guide or monitor treatment for MRSA infections.      Medications:   . docusate sodium  100 mg Oral BID  . enoxaparin (LOVENOX) injection  40 mg Subcutaneous Q24H  . nicotine  14 mg Transdermal Daily  . piperacillin-tazobactam (ZOSYN)  IV  3.375 g Intravenous Q8H  . sodium chloride flush  3 mL Intravenous Q12H  . vancomycin  1,000 mg Intravenous Q8H   Continuous Infusions:   Time spent: 25 min     Marinda Elk  [facility] Pager [phone]  *Please refer to [email or web address], password [email or web address] to get updated schedule on who will round on this patient, as hospitalists switch teams weekly. If 7PM-7AM, please contact night-coverage at [email or web address], password [email or web address] for any overnight needs.  01/10/2016, 8:54 AM

## 2016-01-10 NOTE — Care Management Note (Signed)
Case Management Note  Patient Details  Name: [name] MRN: [id number] Date of Birth: [birth year]  Subjective/Objective:30 y/o f admitted w/leg abscess. Hx: Cocaine abuse. From home. No insurance. Has pcp, has transp home on own.Provided w/health insurance resource, [employer]-patient states she can afford if on [employer] med list.                   Action/Plan:d/c plan home.   Expected Discharge Date:                  Expected Discharge Plan:  Home/Self Care  In-House Referral:     Discharge planning Services  CM Consult  Post Acute Care Choice:    Choice offered to:     DME Arranged:    DME Agency:     HH Arranged:    HH Agency:     Status of Service:  In process, will continue to follow  If discussed at Long Length of Stay Meetings, dates discussed:    Additional Comments:  Lanier ClamMahabir, Kazimierz Springborn, RN 01/10/2016, 3:43 PM

## 2016-01-11 LAB — URINE CULTURE: Culture: <10000 — AB

## 2016-01-11 MED ORDER — SULFAMETHOXAZOLE-TRIMETHOPRIM 800-160 MG PO TABS: 1.0000 | ORAL_TABLET | Freq: Two times a day (BID) | ORAL | Status: DC

## 2016-01-11 NOTE — Discharge Summary (Signed)
Physician Discharge Summary  Tara CarbonValissa J [name] [id number] DOB: 1985/12/27 DOA: 01/09/2016  PCP: Kevin FentonSamuel Bradshaw, MD  Admit date: 01/09/2016 Discharge date: 01/11/2016  Time spent: 35 minutes  Recommendations for Outpatient Follow-up:  1. Follow up with PCP in 2-4 weeks assess erythematous area.   Discharge Diagnoses:  Principal Problem:   Cellulitis and abscess of leg, except foot Active Problems:   Polysubstance abuse   Heart murmur   Tobacco use disorder   Discharge Condition: stable  Diet recommendation: regular  Filed Weights   01/09/16 2038  Weight: 68.04 kg (150 lb)    History of present illness:  30 year old female with no significant past medical history the comes in for fever and left lateral lower extremity abscess and erythema  Hospital Course:  Left lower extremity cellulitis and abscess: She was admitted to the hospital start on IV vancomycin and Rocephin and blood cultures remain negative Tylenol and sees a pain which helped. By the next erythema was receiving and it was less tendency she was changed to Bactrim which she will continue for an additional 5 days. Follow-up with PCP in 2 weeks.  Polysubstance abuse: Tylenol was only use for pain and fever.  Procedures:  None  Consultations:  None  Discharge Exam: Filed Vitals:   01/10/16 2122 01/11/16 0656  BP: 102/70 95/51  Pulse: 56 54  Temp: 98.6 F (37 C) 97.8 F (36.6 C)  Resp: 18 18    General: Awake alert and oriented 3 Cardiovascular: Regular rate and rhythm Respiratory: Good air movement or to auscultation Skin: Erythema has receded not warm or tender to touch.  Discharge Instructions   Discharge Instructions    Diet - low sodium heart healthy    Complete by:  As directed      Increase activity slowly    Complete by:  As directed           Current Discharge Medication List    START taking these medications   Details  sulfamethoxazole-trimethoprim (BACTRIM DS,SEPTRA  DS) 800-160 MG tablet Take 1 tablet by mouth 2 (two) times daily. Qty: 10 tablet, Refills: 0      CONTINUE these medications which have NOT CHANGED   Details  acetaminophen (TYLENOL) 500 MG tablet Take 500 mg by mouth every 6 (six) hours as needed for fever.       No Known Allergies Follow-up Information    Follow up with Kevin FentonSamuel Bradshaw, MD In 2 weeks.   Specialty:  Family Medicine   Contact information:   [address] [place] [address] [phone]        The results of significant diagnostics from this hospitalization (including imaging, microbiology, ancillary and laboratory) are listed below for reference.    Significant Diagnostic Studies: No results found.  Microbiology: Recent Results (from the past 240 hour(s))  Urine culture     Status: Abnormal   Collection Time: 01/09/16  8:50 PM  Result Value Ref Range Status   Specimen Description URINE, CLEAN CATCH  Final   Special Requests NONE  Final   Culture (A)  Final    <10,000 COLONIES/mL INSIGNIFICANT GROWTH Performed at [facility]    Report Status 01/11/2016 FINAL  Final  Blood Culture (routine x 2)     Status: None (Preliminary result)   Collection Time: 01/09/16  9:00 PM  Result Value Ref Range Status   Specimen Description BLOOD RIGHT ANTECUBITAL  Final   Special Requests BOTTLES DRAWN AEROBIC AND ANAEROBIC 5CC  Final  Culture   Final    NO GROWTH < 24 HOURS Performed at [facility]    Report Status PENDING  Incomplete  MRSA PCR Screening     Status: None   Collection Time: 01/10/16 12:57 AM  Result Value Ref Range Status   MRSA by PCR NEGATIVE NEGATIVE Final    Comment:        The GeneXpert MRSA Assay (FDA approved for NASAL specimens only), is one component of a comprehensive MRSA colonization surveillance program. It is not intended to diagnose MRSA infection nor to guide or monitor treatment for MRSA infections.      Labs: Basic Metabolic Panel:  Recent Labs Lab  01/09/16 2036 01/10/16 0431  NA 138 138  K 3.8 3.7  CL 106 109  CO2 26 23  GLUCOSE 98 98  BUN 7 6  CREATININE 0.63 0.59  CALCIUM 8.5* 8.3*   Liver Function Tests:  Recent Labs Lab 01/09/16 2036  AST 14*  ALT 10*  ALKPHOS 55  BILITOT 0.4  PROT 6.9  ALBUMIN 3.7   No results for input(s): LIPASE, AMYLASE in the last 168 hours. No results for input(s): AMMONIA in the last 168 hours. CBC:  Recent Labs Lab 01/09/16 2036  WBC 10.2  NEUTROABS 8.3*  HGB 12.3  HCT 36.9  MCV 88.9  PLT 207   Cardiac Enzymes: No results for input(s): CKTOTAL, CKMB, CKMBINDEX, TROPONINI in the last 168 hours. BNP: BNP (last 3 results) No results for input(s): BNP in the last 8760 hours.  ProBNP (last 3 results) No results for input(s): PROBNP in the last 8760 hours.  CBG: No results for input(s): GLUCAP in the last 168 hours.     Signed:  Marinda ElkFELIZ ORTIZ, Dickie Cloe MD.  [facility] 01/11/2016, 9:10 AM

## 2016-01-11 NOTE — Progress Notes (Signed)
CSW received consult for ETOH abuse, provided patient with outpatient & residential treatment options. Patient [facility], that [facility] and did not find it to be helpful. RN, Darcel BayleyKamna made aware.   No further CSW needs identified - CSW signing off.   Lincoln MaxinKelly Novelle Addair, LCSW [facility] Clinical Social Worker cell #: [phone]

## 2016-01-11 NOTE — Care Management Note (Signed)
Case Management Note  Patient Details  Name: [name] MRN: [id number] Date of Birth: 1985-08-01  Subjective/Objective:  Patient is concerned about going to behavioral health-informed her that only the doctor can address that issues-Nsg notified.No further CM needs.                  Action/Plan:d/c home.   Expected Discharge Date:                  Expected Discharge Plan:  Home/Self Care  In-House Referral:     Discharge planning Services  CM Consult  Post Acute Care Choice:    Choice offered to:     DME Arranged:    DME Agency:     HH Arranged:    HH Agency:     Status of Service:  In process, will continue to follow  If discussed at Long Length of Stay Meetings, dates discussed:    Additional Comments:  Lanier ClamMahabir, Koichi [place], RN 01/11/2016, 9:48 AM

## 2016-01-14 LAB — CULTURE, BLOOD (ROUTINE X 2)
Culture: NO GROWTH
Culture: NO GROWTH

## 2016-07-04 ENCOUNTER — Ambulatory Visit (INDEPENDENT_AMBULATORY_CARE_PROVIDER_SITE_OTHER): Payer: Medicaid Other | Admitting: Women's Health

## 2016-07-04 ENCOUNTER — Encounter: Payer: Self-pay | Admitting: Women's Health

## 2016-07-04 VITALS — BP 106/64 | HR 61 | Ht 67.0 in | Wt 198.0 lb

## 2016-07-04 DIAGNOSIS — Z30017 Encounter for initial prescription of implantable subdermal contraceptive: Secondary | ICD-10-CM

## 2016-07-04 DIAGNOSIS — Z3049 Encounter for surveillance of other contraceptives: Secondary | ICD-10-CM | POA: Diagnosis not present

## 2016-07-04 DIAGNOSIS — Z3202 Encounter for pregnancy test, result negative: Secondary | ICD-10-CM | POA: Diagnosis not present

## 2016-07-04 DIAGNOSIS — Z3046 Encounter for surveillance of implantable subdermal contraceptive: Secondary | ICD-10-CM

## 2016-07-04 NOTE — Patient Instructions (Signed)
Keep the area clean and dry.  You can remove the big bandage in 24 hours, and the small steri-strip bandage in 3-5 days.  A back up method, such as condoms, should be used for two weeks. You may have irregular vaginal bleeding for the first 6 months after the Nexplanon is placed, then the bleeding usually lightens and it is possible that you may not have any periods.  If you have any concerns, please give us a call.    Etonogestrel implant What is this medicine? ETONOGESTREL (et oh noe JES trel) is a contraceptive (birth control) device. It is used to prevent pregnancy. It can be used for up to 3 years. COMMON BRAND NAME(S): Implanon, Nexplanon What should I tell my health care provider before I take this medicine? They need to know if you have any of these conditions: -abnormal vaginal bleeding -blood vessel disease or blood clots -cancer of the breast, cervix, or liver -depression -diabetes -gallbladder disease -headaches -heart disease or recent heart attack -high blood pressure -high cholesterol -kidney disease -liver disease -renal disease -seizures -tobacco smoker -an unusual or allergic reaction to etonogestrel, other hormones, anesthetics or antiseptics, medicines, foods, dyes, or preservatives -pregnant or trying to get pregnant -breast-feeding How should I use this medicine? This device is inserted just under the skin on the inner side of your upper arm by a health care professional. Talk to your pediatrician regarding the use of this medicine in children. Special care may be needed. What if I miss a dose? This does not apply. What may interact with this medicine? Do not take this medicine with any of the following medications: -amprenavir -bosentan -fosamprenavir This medicine may also interact with the following medications: -barbiturate medicines for inducing sleep or treating seizures -certain medicines for fungal infections like ketoconazole and  itraconazole -griseofulvin -medicines to treat seizures like carbamazepine, felbamate, oxcarbazepine, phenytoin, topiramate -modafinil -phenylbutazone -rifampin -some medicines to treat HIV infection like atazanavir, indinavir, lopinavir, nelfinavir, tipranavir, ritonavir -St. John's wort What should I watch for while using this medicine? This product does not protect you against HIV infection (AIDS) or other sexually transmitted diseases. You should be able to feel the implant by pressing your fingertips over the skin where it was inserted. Contact your doctor if you cannot feel the implant, and use a non-hormonal birth control method (such as condoms) until your doctor confirms that the implant is in place. If you feel that the implant may have broken or become bent while in your arm, contact your healthcare provider. What side effects may I notice from receiving this medicine? Side effects that you should report to your doctor or health care professional as soon as possible: -allergic reactions like skin rash, itching or hives, swelling of the face, lips, or tongue -breast lumps -changes in emotions or moods -depressed mood -heavy or prolonged menstrual bleeding -pain, irritation, swelling, or bruising at the insertion site -scar at site of insertion -signs of infection at the insertion site such as fever, and skin redness, pain or discharge -signs of pregnancy -signs and symptoms of a blood clot such as breathing problems; changes in vision; chest pain; severe, sudden headache; pain, swelling, warmth in the leg; trouble speaking; sudden numbness or weakness of the face, arm or leg -signs and symptoms of liver injury like dark yellow or brown urine; general ill feeling or flu-like symptoms; light-colored stools; loss of appetite; nausea; right upper belly pain; unusually weak or tired; yellowing of the eyes or skin -unusual vaginal   bleeding, discharge -signs and symptoms of a stroke like  changes in vision; confusion; trouble speaking or understanding; severe headaches; sudden numbness or weakness of the face, arm or leg; trouble walking; dizziness; loss of balance or coordination Side effects that usually do not require medical attention (report to your doctor or health care professional if they continue or are bothersome): -acne -back pain -breast pain -changes in weight -dizziness -general ill feeling or flu-like symptoms -headache -irregular menstrual bleeding -nausea -sore throat -vaginal irritation or inflammation Where should I keep my medicine? This drug is given in a hospital or clinic and will not be stored at home.  2017 Elsevier/Gold Standard (2015-08-10 10:56:20)  

## 2016-07-04 NOTE — Progress Notes (Signed)
[name] is a 30 y.o. year old 823P2012 Caucasian female here for Nexplanon removal and reinsertion.  She was given informed consent for removal and reinsertion of her Nexplanon. Her Nexplanon was placed 07/01/13, No LMP recorded. Patient has had an implant., and her pregnancy test today was neg. Reports gaining 50lbs since summer- attributes it to nexplanon 'starting to run out', hasn't changed diet, is walking 701mile/day, did break up w/ bf so is no longer having sex 3x/wk. Discussed diet/decrease in activity is likely reason- if was nexplanon would have started after insertion. To increase exercise/decrease carbs.   Risks/benefits/side effects of Nexplanon have been discussed and her questions have been answered.  Specifically, a failure rate of 07/998 has been reported, with an increased failure rate if pt takes St. John's Wort and/or antiseizure medicaitons.  Tara CarbonValissa J Ron is aware of the common side effect of irregular bleeding, which the incidence of decreases over time.  BP 106/64   Pulse 61   Ht 5\' 7"  (1.702 m)   Wt 198 lb (89.8 kg)   BMI 31.01 kg/m  No LMP recorded. Patient has had an implant. No results found for this or any previous visit (from the past 24 hour(s)).   Appropriate time out taken. Nexplanon site identified.  Area prepped in usual sterile fashon. Two cc's of 2% lidocaine was used to anesthetize the area. A small stab incision was made right beside the implant on the distal portion.  The Nexplanon rod was grasped using hemostats and removed intact without difficulty.  The area was cleansed again with betadine and the Nexplanon was inserted per manufacturer's recommendations without difficulty.  Steri-strips and a pressure bandage was applied.  There was less than 3 cc blood loss. There were no complications.  The patient tolerated the procedure well.  She was instructed to keep the area clean and dry, remove pressure bandage in 24 hours, and keep insertion site  covered with the steri-strips for 3-5 days.  She was given a card indicating date Nexplanon was inserted and date it needs to be removed.   Follow-up 1mth for pap & physical.   Marge DuncansBooker, Marchella Hibbard Randall CNM, [facility] 07/04/2016 10:40 AM

## 2016-08-01 ENCOUNTER — Encounter: Payer: Medicaid Other | Admitting: Family Medicine

## 2016-08-01 ENCOUNTER — Other Ambulatory Visit: Payer: Medicaid Other | Admitting: Women's Health

## 2016-08-13 ENCOUNTER — Ambulatory Visit (INDEPENDENT_AMBULATORY_CARE_PROVIDER_SITE_OTHER): Payer: Medicaid Other | Admitting: Family Medicine

## 2016-08-13 ENCOUNTER — Encounter: Payer: Self-pay | Admitting: Family Medicine

## 2016-08-13 VITALS — BP 110/67 | HR 84 | Temp 98.8°F | Ht 67.0 in | Wt 197.0 lb

## 2016-08-13 DIAGNOSIS — Z0000 Encounter for general adult medical examination without abnormal findings: Secondary | ICD-10-CM | POA: Diagnosis not present

## 2016-08-13 DIAGNOSIS — R590 Localized enlarged lymph nodes: Secondary | ICD-10-CM

## 2016-08-13 DIAGNOSIS — F1910 Other psychoactive substance abuse, uncomplicated: Secondary | ICD-10-CM

## 2016-08-13 NOTE — Patient Instructions (Signed)
Great to see you!   

## 2016-08-13 NOTE — Progress Notes (Signed)
   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  Patient is in good health and has no complaints. She is frustrated with weight gain and difficulty losing weight.  She is a history of polysubstance abuse and hospitalization at behavioral health with suicidal ideation. She has done quite well after her discharge 2 years ago. She had one relapse about 7 months ago and was hospitalized for lower leg cellulitis. She has now been clean for 7 months.  She's never had an issue with alcohol. She has never had a DUI.  Patient is exercising regularly and watching her diet carefully.  PMH: Smoking status noted ROS: Per HPI  Objective: BP 110/67   Pulse 84   Temp 98.8 F (37.1 C) (Oral)   Ht 5\' 7"  (1.702 m)   Wt 197 lb (89.4 kg)   BMI 30.85 kg/m  Gen: NAD, alert, cooperative with exam HEENT: NCAT, oropharynx clear Neck: No thyromegaly CV: RRR, good S1/S2, no murmur Resp: CTABL, no wheezes, non-labored Abd: SNTND, BS present, no guarding or organomegaly Ext: No edema, warm Neuro: Alert and oriented, No gross deficits Skin Bilateral small mobile lymph nodes in the occipital area, approximately 1 cm in diameter each  Paperwork filled out for DOT, no restriction  Assessment and plan:  # Annual physical exam Patient doing very well, her weight is a struggle but she is making good therapeutic lifestyle changes No labs necessary except for urine drug screen given history of substance abuse and verification with DOT paperwork.  # Polysubstance abuse Doing well, urine drug screen given relapse 7 months ago.   Occipital  lymphadenopathy One week duration, watchful waiting for now    Orders Placed This Encounter  Procedures  . ToxASSURE [facility] (MW), Urine     Murtis SinkSam Bradshaw, MD [facility] 08/13/2016, 5:26 PM

## 2016-08-18 LAB — TOXASSURE SELECT 13 (MW), URINE

## 2016-12-09 ENCOUNTER — Telehealth: Payer: Self-pay | Admitting: Family Medicine

## 2016-12-09 NOTE — Telephone Encounter (Signed)
Pt called back   Information given

## 2016-12-09 NOTE — Telephone Encounter (Signed)
Attempted to re contact pt regarding Dr Felipa EmoryBradshaw's recommendation Unable to reach pt

## 2016-12-09 NOTE — Telephone Encounter (Signed)
Would recommend ED evaluation.   If she is willing she could [facility] [phone] before to see if they can help her right away.   Murtis SinkSam Azzure Garabedian, MD [facility] 12/09/2016, 3:01 PM

## 2016-12-11 ENCOUNTER — Ambulatory Visit: Payer: Medicaid Other | Admitting: Family Medicine

## 2016-12-12 ENCOUNTER — Telehealth: Payer: Self-pay | Admitting: Family Medicine

## 2016-12-12 ENCOUNTER — Encounter: Payer: Self-pay | Admitting: Family Medicine

## 2016-12-12 NOTE — Telephone Encounter (Signed)
Attempted to contact patient about missed apt. No answer and no vm.

## 2016-12-15 ENCOUNTER — Emergency Department (HOSPITAL_COMMUNITY)
Admission: EM | Admit: 2016-12-15 | Discharge: 2016-12-17 | Disposition: A | Discharge status: Other Acute Inpatient Hospital | Payer: Medicaid Other | Attending: Emergency Medicine | Admitting: Emergency Medicine

## 2016-12-15 ENCOUNTER — Encounter (HOSPITAL_COMMUNITY): Payer: Self-pay | Admitting: Emergency Medicine

## 2016-12-15 DIAGNOSIS — F17210 Nicotine dependence, cigarettes, uncomplicated: Secondary | ICD-10-CM | POA: Insufficient documentation

## 2016-12-15 DIAGNOSIS — F1110 Opioid abuse, uncomplicated: Secondary | ICD-10-CM | POA: Insufficient documentation

## 2016-12-15 DIAGNOSIS — Z5181 Encounter for therapeutic drug level monitoring: Secondary | ICD-10-CM | POA: Insufficient documentation

## 2016-12-15 DIAGNOSIS — F1290 Cannabis use, unspecified, uncomplicated: Secondary | ICD-10-CM | POA: Diagnosis not present

## 2016-12-15 DIAGNOSIS — F1490 Cocaine use, unspecified, uncomplicated: Secondary | ICD-10-CM | POA: Diagnosis not present

## 2016-12-15 HISTORY — DX: Other psychoactive substance abuse, uncomplicated: F19.10

## 2016-12-15 HISTORY — DX: Chronic pain syndrome: G89.4

## 2016-12-15 LAB — BASIC METABOLIC PANEL
Anion gap: 9 (ref 5–15)
BUN: 12 mg/dL (ref 6–20)
CALCIUM: 8.7 mg/dL — AB (ref 8.9–10.3)
CO2: 23 mmol/L (ref 22–32)
CREATININE: 0.58 mg/dL (ref 0.44–1.00)
Chloride: 108 mmol/L (ref 101–111)
GFR calc non Af Amer: >60 mL/min (ref >60)
Glucose, Bld: 142 mg/dL — ABNORMAL HIGH (ref 65–99)
Potassium: 3.3 mmol/L — ABNORMAL LOW (ref 3.5–5.1)
SODIUM: 140 mmol/L (ref 135–145)

## 2016-12-15 LAB — RAPID URINE DRUG SCREEN, HOSP PERFORMED
Amphetamines: NOT DETECTED
Barbiturates: NOT DETECTED
Benzodiazepines: NOT DETECTED
Cocaine: POSITIVE — AB
OPIATES: POSITIVE — AB
TETRAHYDROCANNABINOL: POSITIVE — AB

## 2016-12-15 LAB — CBC WITH DIFFERENTIAL/PLATELET
BASOS PCT: 0 %
Basophils Absolute: 0 10*3/uL (ref 0.0–0.1)
EOS ABS: 0 10*3/uL (ref 0.0–0.7)
Eosinophils Relative: 0 %
HCT: 37.9 % (ref 36.0–46.0)
Hemoglobin: 12.7 g/dL (ref 12.0–15.0)
Lymphocytes Relative: 26 %
Lymphs Abs: 1.9 10*3/uL (ref 0.7–4.0)
MCH: 29.4 pg (ref 26.0–34.0)
MCHC: 33.5 g/dL (ref 30.0–36.0)
MCV: 87.7 fL (ref 78.0–100.0)
MONOS PCT: 6 %
Monocytes Absolute: 0.4 10*3/uL (ref 0.1–1.0)
Neutro Abs: 5 10*3/uL (ref 1.7–7.7)
Neutrophils Relative %: 68 %
PLATELETS: 267 10*3/uL (ref 150–400)
RBC: 4.32 MIL/uL (ref 3.87–5.11)
RDW: 13.3 % (ref 11.5–15.5)
WBC: 7.3 10*3/uL (ref 4.0–10.5)

## 2016-12-15 LAB — ETHANOL

## 2016-12-15 LAB — PREGNANCY, URINE: Preg Test, Ur: NEGATIVE

## 2016-12-15 NOTE — ED Notes (Signed)
TTS consult in progress at this time. 
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
## 2016-12-15 NOTE — ED Notes (Signed)
Pts belongings placed in a locker. (Shirt, pants, bra, shoes, and belly button ring.)

## 2016-12-15 NOTE — ED Notes (Signed)
Pt given meal to eat at her request.

## 2016-12-15 NOTE — ED Notes (Signed)
Patient asleep at this time. Equal rise and fall of chest, equal respirations. Patient shows no sign of distress.

## 2016-12-15 NOTE — ED Notes (Signed)
Patient resting.

## 2016-12-15 NOTE — Progress Notes (Signed)
Pt meets criteria for inpt treatment. TTS faxed referrals to the following inpt facilities for review:  [facility], [facility], [facility], [facility], [facility], [facility], [facility], [facility], [facility]   TTS will continue to seek bed placement.  Princess BruinsAquicha Siani Utke, LCSWA

## 2016-12-15 NOTE — ED Triage Notes (Signed)
Want to go to behavorial health for addiction.  States she has been using iv heroin and wants to stop.  Denies si/hi
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
## 2016-12-16 MED ORDER — NICOTINE 21 MG/24HR TD PT24
21.0000 mg | MEDICATED_PATCH | Freq: Once | TRANSDERMAL | Status: AC
  Administered 2016-12-16: 21 mg via TRANSDERMAL
  Filled 2016-12-16: qty 1

## 2016-12-16 MED ORDER — METHOCARBAMOL 500 MG PO TABS
500.0000 mg | ORAL_TABLET | Freq: Three times a day (TID) | ORAL | Status: DC | PRN
  Administered 2016-12-16 – 2016-12-17 (×3): 500 mg via ORAL
  Filled 2016-12-16 (×3): qty 1

## 2016-12-16 MED ORDER — HYDROXYZINE HCL 25 MG PO TABS
25.0000 mg | ORAL_TABLET | Freq: Four times a day (QID) | ORAL | Status: DC | PRN
  Administered 2016-12-16 – 2016-12-17 (×3): 25 mg via ORAL
  Filled 2016-12-16 (×3): qty 1

## 2016-12-16 MED ORDER — CLONIDINE HCL 0.1 MG PO TABS
0.1000 mg | ORAL_TABLET | Freq: Two times a day (BID) | ORAL | Status: DC
Start: 2016-12-18 — End: 2016-12-17

## 2016-12-16 MED ORDER — ONDANSETRON 4 MG PO TBDP
4.0000 mg | ORAL_TABLET | Freq: Four times a day (QID) | ORAL | Status: DC | PRN
  Administered 2016-12-17: 4 mg via ORAL
  Filled 2016-12-16: qty 1

## 2016-12-16 MED ORDER — NAPROXEN 250 MG PO TABS
500.0000 mg | ORAL_TABLET | Freq: Two times a day (BID) | ORAL | Status: DC | PRN
  Administered 2016-12-16 – 2016-12-17 (×2): 500 mg via ORAL
  Filled 2016-12-16 (×2): qty 2

## 2016-12-16 MED ORDER — HYDROXYZINE HCL 25 MG PO TABS
25.0000 mg | ORAL_TABLET | Freq: Once | ORAL | Status: AC
  Administered 2016-12-16: 25 mg via ORAL
  Filled 2016-12-16: qty 1

## 2016-12-16 MED ORDER — DICYCLOMINE HCL 20 MG PO TABS
20.0000 mg | ORAL_TABLET | Freq: Four times a day (QID) | ORAL | Status: DC | PRN
  Filled 2016-12-16: qty 1

## 2016-12-16 MED ORDER — CLONIDINE HCL 0.1 MG PO TABS
0.1000 mg | ORAL_TABLET | Freq: Four times a day (QID) | ORAL | Status: DC
  Administered 2016-12-16 – 2016-12-17 (×2): 0.1 mg via ORAL
  Filled 2016-12-16 (×2): qty 1

## 2016-12-16 MED ORDER — CLONIDINE HCL 0.1 MG PO TABS: 0.1000 mg | ORAL_TABLET | Freq: Every day | ORAL | Status: DC

## 2016-12-16 MED ORDER — LOPERAMIDE HCL 2 MG PO CAPS: 2.0000 mg | ORAL_CAPSULE | ORAL | Status: DC | PRN

## 2016-12-16 NOTE — ED Notes (Signed)
Pt requesting anxiety medication

## 2016-12-16 NOTE — BH Assessment (Signed)
[facility] Assessment Progress Note  Reassessment: When asked how she was doing, pt said, "Not too good". Pt states that she continues to feel suicidal and wants treatment. She denies HI, AVH. TTS to continue seeking placement.

## 2016-12-16 NOTE — ED Notes (Signed)
Patient has been resting well tonight. No distress noted.

## 2016-12-16 NOTE — ED Notes (Signed)
Patient states that she hurts all over her body. States that she can not put a number on her pain, but she just doesn't feel well. Patient made aware that breakfast would be coming around 7 am.

## 2016-12-16 NOTE — ED Provider Notes (Signed)
Pt feels like she is withdrawing.  Requesting medications.  Opiate withdrawal protocol meds ordered.   Linwood DibblesKnapp, Saylor Sheckler, MD 12/16/16 [phone]

## 2016-12-17 ENCOUNTER — Inpatient Hospital Stay
Admission: AD | Admit: 2016-12-17 | Discharge: 2016-12-22 | DRG: 885 | Disposition: A | Discharge status: Home / Self Care | Payer: Medicaid Other | Source: Intra-hospital | Attending: Psychiatry | Admitting: Psychiatry

## 2016-12-17 DIAGNOSIS — F1994 Other psychoactive substance use, unspecified with psychoactive substance-induced mood disorder: Secondary | ICD-10-CM | POA: Diagnosis present

## 2016-12-17 DIAGNOSIS — F1420 Cocaine dependence, uncomplicated: Secondary | ICD-10-CM | POA: Diagnosis present

## 2016-12-17 DIAGNOSIS — F17200 Nicotine dependence, unspecified, uncomplicated: Secondary | ICD-10-CM | POA: Diagnosis present

## 2016-12-17 DIAGNOSIS — F4310 Post-traumatic stress disorder, unspecified: Secondary | ICD-10-CM | POA: Diagnosis present

## 2016-12-17 DIAGNOSIS — G894 Chronic pain syndrome: Secondary | ICD-10-CM | POA: Diagnosis present

## 2016-12-17 DIAGNOSIS — F410 Panic disorder [episodic paroxysmal anxiety] without agoraphobia: Secondary | ICD-10-CM | POA: Diagnosis present

## 2016-12-17 DIAGNOSIS — F1120 Opioid dependence, uncomplicated: Secondary | ICD-10-CM | POA: Diagnosis present

## 2016-12-17 DIAGNOSIS — F332 Major depressive disorder, recurrent severe without psychotic features: Secondary | ICD-10-CM | POA: Diagnosis not present

## 2016-12-17 DIAGNOSIS — F1220 Cannabis dependence, uncomplicated: Secondary | ICD-10-CM | POA: Diagnosis present

## 2016-12-17 DIAGNOSIS — R001 Bradycardia, unspecified: Secondary | ICD-10-CM | POA: Diagnosis present

## 2016-12-17 DIAGNOSIS — R45851 Suicidal ideations: Secondary | ICD-10-CM | POA: Diagnosis present

## 2016-12-17 DIAGNOSIS — F1110 Opioid abuse, uncomplicated: Secondary | ICD-10-CM | POA: Diagnosis not present

## 2016-12-17 DIAGNOSIS — F17210 Nicotine dependence, cigarettes, uncomplicated: Secondary | ICD-10-CM | POA: Diagnosis present

## 2016-12-17 MED ORDER — LOPERAMIDE HCL 2 MG PO CAPS
2.0000 mg | ORAL_CAPSULE | ORAL | Status: DC | PRN
  Administered 2016-12-17: 2 mg via ORAL
  Filled 2016-12-17: qty 1

## 2016-12-17 MED ORDER — ALUM & MAG HYDROXIDE-SIMETH 200-200-20 MG/5ML PO SUSP: 30.0000 mL | ORAL | Status: DC | PRN

## 2016-12-17 MED ORDER — ONDANSETRON 4 MG PO TBDP
4.0000 mg | ORAL_TABLET | Freq: Four times a day (QID) | ORAL | Status: DC | PRN
  Administered 2016-12-17: 4 mg via ORAL
  Filled 2016-12-17: qty 1

## 2016-12-17 MED ORDER — NICOTINE 21 MG/24HR TD PT24
21.0000 mg | MEDICATED_PATCH | Freq: Every day | TRANSDERMAL | Status: DC
  Administered 2016-12-19 – 2016-12-21 (×3): 21 mg via TRANSDERMAL
  Filled 2016-12-17 (×4): qty 1

## 2016-12-17 MED ORDER — HYDROXYZINE HCL 25 MG PO TABS
25.0000 mg | ORAL_TABLET | Freq: Four times a day (QID) | ORAL | Status: AC | PRN
  Administered 2016-12-17 – 2016-12-20 (×5): 25 mg via ORAL
  Filled 2016-12-17 (×5): qty 1

## 2016-12-17 MED ORDER — NAPROXEN 500 MG PO TABS
500.0000 mg | ORAL_TABLET | Freq: Two times a day (BID) | ORAL | Status: DC | PRN
Start: 2016-12-17 — End: 2016-12-18
  Administered 2016-12-17: 500 mg via ORAL
  Filled 2016-12-17: qty 1

## 2016-12-17 MED ORDER — LORAZEPAM 1 MG PO TABS
1.0000 mg | ORAL_TABLET | Freq: Once | ORAL | Status: AC
  Administered 2016-12-17: 1 mg via ORAL
  Filled 2016-12-17: qty 1

## 2016-12-17 MED ORDER — MAGNESIUM HYDROXIDE 400 MG/5ML PO SUSP: 30.0000 mL | Freq: Every day | ORAL | Status: DC | PRN

## 2016-12-17 MED ORDER — DICYCLOMINE HCL 10 MG PO CAPS: 20.0000 mg | ORAL_CAPSULE | Freq: Four times a day (QID) | ORAL | Status: DC | PRN

## 2016-12-17 MED ORDER — GABAPENTIN 300 MG PO CAPS
300.0000 mg | ORAL_CAPSULE | Freq: Once | ORAL | Status: AC
  Administered 2016-12-17: 300 mg via ORAL
  Filled 2016-12-17: qty 1

## 2016-12-17 MED ORDER — CLONIDINE HCL 0.1 MG PO TABS
0.1000 mg | ORAL_TABLET | Freq: Once | ORAL | Status: AC
  Administered 2016-12-17: 0.1 mg via ORAL
  Filled 2016-12-17: qty 1

## 2016-12-17 MED ORDER — QUETIAPINE FUMARATE 100 MG PO TABS
100.0000 mg | ORAL_TABLET | Freq: Every day | ORAL | Status: DC
  Administered 2016-12-17 – 2016-12-19 (×3): 100 mg via ORAL
  Filled 2016-12-17 (×3): qty 1

## 2016-12-17 MED ORDER — TRAZODONE HCL 100 MG PO TABS
100.0000 mg | ORAL_TABLET | Freq: Every evening | ORAL | Status: DC | PRN
  Administered 2016-12-17 – 2016-12-19 (×2): 100 mg via ORAL
  Filled 2016-12-17 (×2): qty 1

## 2016-12-17 MED ORDER — DICYCLOMINE HCL 10 MG PO CAPS
20.0000 mg | ORAL_CAPSULE | Freq: Four times a day (QID) | ORAL | Status: DC | PRN
Start: 2016-12-17 — End: 2016-12-18
  Administered 2016-12-17: 20 mg via ORAL
  Filled 2016-12-17 (×2): qty 2

## 2016-12-17 MED ORDER — CLONIDINE HCL 0.1 MG PO TABS
0.1000 mg | ORAL_TABLET | Freq: Three times a day (TID) | ORAL | Status: DC
  Administered 2016-12-19 (×2): 0.1 mg via ORAL
  Filled 2016-12-17 (×3): qty 1

## 2016-12-17 MED ORDER — ACETAMINOPHEN 325 MG PO TABS
650.0000 mg | ORAL_TABLET | Freq: Four times a day (QID) | ORAL | Status: DC | PRN
  Administered 2016-12-20: 650 mg via ORAL
  Filled 2016-12-17: qty 2

## 2016-12-17 MED ORDER — METHOCARBAMOL 500 MG PO TABS
500.0000 mg | ORAL_TABLET | Freq: Three times a day (TID) | ORAL | Status: DC | PRN
  Administered 2016-12-17: 500 mg via ORAL
  Filled 2016-12-17: qty 1

## 2016-12-17 NOTE — ED Notes (Signed)
Patient is tossing and turning in bed trying to rest. States that she is having restless leg syndrome.

## 2016-12-17 NOTE — ED Notes (Signed)
Pt states her arms & legs are going nuts like restless leg syndrome. Wanting something better. Pt informed that we did not have anything additional & was not time for any other medicines.

## 2016-12-17 NOTE — ED Notes (Signed)
Patient ambulated to bathroom with no difficulty or assistance. Gave patient meal tray.

## 2016-12-17 NOTE — ED Notes (Signed)
Patient requesting ativan due to increased agitation. Verbal order for ativan 1mg  PO to be given.

## 2016-12-17 NOTE — BH Assessment (Signed)
Patient has been accepted to [facility].  Accepting physician is Dr. Lucianne MussKumar.  Attending Physician will be Dr. Jennet MaduroPucilowska.  Patient has been assigned to room 309, [facility] Charge Nurse [facility].  Call report to [phone].  Representative/Transfer Coordinator is Riley ChurchesKendall Kanyah Matsushima Patient [facility] Access Bethann Berkshire(Trisha)  [facility] Staff Carney Bern(Jean, TTS) made aware of acceptance.

## 2016-12-17 NOTE — ED Notes (Signed)
Patient requesting medication for anxiety, states "the only thing that helps is ativan and I need my muscle relaxer." Advised patient of PRN medication ordered for anxiety was not ativan and it was too early to administer robaxin for pain. Advised patient she could have PRN naproxen if needed. Patient agreed to take naproxen and hydroxyzine at this time.

## 2016-12-17 NOTE — Progress Notes (Signed)
CSW contacted [facility] Nurse, Lorrie, to inform that pt. has been [facility], Bed  [facility].  Dr. Lucianne MussKumar accepting.  Dr. Jennet MaduroPucilowska attending. Call report to [phone].  Charge Nurse will determine transfer time.    Timmothy EulerJean T. Kaylyn LimSutter, MSW, LCSWA Clinical Social Work Disposition [phone]

## 2016-12-17 NOTE — ED Notes (Signed)
[facility] arrived to pick up patient and transfer to [facility].

## 2016-12-17 NOTE — ED Notes (Signed)
Patient asking for juice and nausea medicine. RN Ron made aware .

## 2016-12-17 NOTE — Tx Team (Signed)
Initial Treatment Plan 12/17/2016 11:22 PM Cindi CarbonValissa J Cerney WUJ:[id number]    PATIENT STRESSORS: Health problems Medication change or noncompliance Substance abuse   PATIENT STRENGTHS: Average or above average intelligence General fund of knowledge Motivation for treatment/growth Work skills   PATIENT IDENTIFIED PROBLEMS: Risk for suicide  SA (opioid, THC, Heroin, Cocaine)  "relapsing"                 DISCHARGE CRITERIA:  Improved stabilization in mood, thinking, and/or behavior Verbal commitment to aftercare and medication compliance  PRELIMINARY DISCHARGE PLAN: Attend PHP/IOP Outpatient therapy  PATIENT/FAMILY INVOLVEMENT: This treatment plan has been presented to and reviewed with the patient, Cindi CarbonValissa J Geraldo.  The patient and family have been given the opportunity to ask questions and make suggestions.  Delos HaringPhillips, Hedi Barkan A, RN 12/17/2016, 11:22 PM

## 2016-12-17 NOTE — ED Notes (Signed)
Patient asking for muscle relaxer and pain meds. Notified Arlys JohnBrian the nurse and informed her that it wasn't time for those medications.

## 2016-12-17 NOTE — ED Notes (Signed)
Patient wanting muscle relaxer and juice. Patient given Sprite and eating potato chips at this time. RN Ron is in room with patient.

## 2016-12-17 NOTE — BH Assessment (Signed)
Per Dr. Kumar, patient meets inpatient criteria and will be transferred to [facility]. This was discussed during the morning Bridge Call. Patient's information was forwarded to [facility] Attending Physician (Dr. Hernandez) and [facility] Charge Nurse (Gwen F.).  

## 2016-12-17 NOTE — ED Notes (Signed)
Patient went to restroom. Upon returning to room patient is very upset and tearful at this time. States that she is going to leave. States that she is in a great deal of pain. RN made aware.

## 2016-12-17 NOTE — ED Notes (Signed)
Patient went to restroom, returned to room states that " that shit didn't work either". Patient wanting to know if there is something else she can get at this time. Told patient I would check with RN.

## 2016-12-17 NOTE — ED Notes (Signed)
Pt continues to c/o feeling restless.  States she feels like her muscles are tight and c/o generalized pain.  Dr. Manus Gunningancour notifed.  Orders received.

## 2016-12-17 NOTE — ED Notes (Signed)
Patient has been very anxious and nervous tonight. Patient has been to restroom several times. States that she is not able to sleep. Patient has been asking for medication to help her sleep. RN JJ aware. Patient states that she can not do this, states that she wants to call someone to come get her so she can sign herself out. Patient states that she can not do this. RN and EDP made aware of her wishes.

## 2016-12-17 NOTE — ED Notes (Signed)
Patient went to restroom 

## 2016-12-18 DIAGNOSIS — F332 Major depressive disorder, recurrent severe without psychotic features: Principal | ICD-10-CM

## 2016-12-18 MED ORDER — BUPRENORPHINE HCL-NALOXONE HCL 8-2 MG SL SUBL
1.0000 | SUBLINGUAL_TABLET | Freq: Every day | SUBLINGUAL | Status: DC
Start: 2016-12-18 — End: 2016-12-19
  Administered 2016-12-18: 1 via SUBLINGUAL
  Filled 2016-12-18: qty 1

## 2016-12-18 NOTE — Tx Team (Signed)
Interdisciplinary Treatment and Diagnostic Plan Update  12/18/2016 Time of Session: 10:30 AM [name] MRN: [id number]  Principal Diagnosis: Major depressive disorder, recurrent severe without psychotic features (HCC)  Secondary Diagnoses: Principal Problem:   Major depressive disorder, recurrent severe without psychotic features (HCC) Active Problems:   Substance induced mood disorder (HCC)   Tobacco use disorder   Opioid use disorder, moderate, dependence (HCC)   Cocaine use disorder, moderate, dependence (HCC)   Cannabis use disorder, moderate, dependence (HCC)   Current Medications:  Current Facility-Administered Medications  Medication Dose Route Frequency Provider Last Rate Last Dose  . acetaminophen (TYLENOL) tablet 650 mg  650 mg Oral Q6H PRN Jimmy Footman, MD      . alum & mag hydroxide-simeth (MAALOX/MYLANTA) 200-200-20 MG/5ML suspension 30 mL  30 mL Oral Q4H PRN Jimmy Footman, MD      . cloNIDine (CATAPRES) tablet 0.1 mg  0.1 mg Oral TID Jimmy Footman, MD      . dicyclomine (BENTYL) capsule 20 mg  20 mg Oral Q6H PRN Jimmy Footman, MD   20 mg at 12/17/16 2150  . hydrOXYzine (ATARAX/VISTARIL) tablet 25 mg  25 mg Oral Q6H PRN Jimmy Footman, MD   25 mg at 12/17/16 2151  . loperamide (IMODIUM) capsule 2-4 mg  2-4 mg Oral PRN Jimmy Footman, MD   2 mg at 12/17/16 2150  . magnesium hydroxide (MILK OF MAGNESIA) suspension 30 mL  30 mL Oral Daily PRN Jimmy Footman, MD      . methocarbamol (ROBAXIN) tablet 500 mg  500 mg Oral Q8H PRN Jimmy Footman, MD   500 mg at 12/17/16 2150  . naproxen (NAPROSYN) tablet 500 mg  500 mg Oral BID PRN Jimmy Footman, MD   500 mg at 12/17/16 2151  . nicotine (NICODERM CQ - dosed in mg/24 hours) patch 21 mg  21 mg Transdermal Daily Jimmy Footman, MD      . ondansetron (ZOFRAN-ODT) disintegrating tablet 4 mg  4 mg Oral Q6H  PRN Jimmy Footman, MD   4 mg at 12/17/16 2151  . QUEtiapine (SEROQUEL) tablet 100 mg  100 mg Oral QHS Pucilowska, Jolanta B, MD   100 mg at 12/17/16 2151  . traZODone (DESYREL) tablet 100 mg  100 mg Oral QHS PRN Clapacs, Jackquline Denmark, MD   100 mg at 12/17/16 2150   PTA Medications: No prescriptions prior to admission.    Patient Stressors: Health problems Medication change or noncompliance Substance abuse  Patient Strengths: Average or above average intelligence General fund of knowledge Motivation for treatment/growth Work skills  Treatment Modalities: Medication Management, Group therapy, Case management,  1 to 1 session with clinician, Psychoeducation, Recreational therapy.   Physician Treatment Plan for Primary Diagnosis: Major depressive disorder, recurrent severe without psychotic features (HCC) Long Term Goal(s): Improvement in symptoms so as ready for discharge Improvement in symptoms so as ready for discharge   Short Term Goals: Ability to identify changes in lifestyle to reduce recurrence of condition will improve Ability to verbalize feelings will improve Ability to disclose and discuss suicidal ideas Ability to demonstrate self-control will improve Ability to identify and develop effective coping behaviors will improve Compliance with prescribed medications will improve Ability to identify triggers associated with substance abuse/mental health issues will improve Ability to identify changes in lifestyle to reduce recurrence of condition will improve Ability to demonstrate self-control will improve Ability to identify triggers associated with substance abuse/mental health issues will improve  Medication Management: Evaluate patient's response, side effects, and tolerance  of medication regimen.  Therapeutic Interventions: 1 to 1 sessions, Unit Group sessions and Medication administration.  Evaluation of Outcomes: Progressing  Physician Treatment Plan for  Secondary Diagnosis: Principal Problem:   Major depressive disorder, recurrent severe without psychotic features (HCC) Active Problems:   Substance induced mood disorder (HCC)   Tobacco use disorder   Opioid use disorder, moderate, dependence (HCC)   Cocaine use disorder, moderate, dependence (HCC)   Cannabis use disorder, moderate, dependence (HCC)  Long Term Goal(s): Improvement in symptoms so as ready for discharge Improvement in symptoms so as ready for discharge   Short Term Goals: Ability to identify changes in lifestyle to reduce recurrence of condition will improve Ability to verbalize feelings will improve Ability to disclose and discuss suicidal ideas Ability to demonstrate self-control will improve Ability to identify and develop effective coping behaviors will improve Compliance with prescribed medications will improve Ability to identify triggers associated with substance abuse/mental health issues will improve Ability to identify changes in lifestyle to reduce recurrence of condition will improve Ability to demonstrate self-control will improve Ability to identify triggers associated with substance abuse/mental health issues will improve     Medication Management: Evaluate patient's response, side effects, and tolerance of medication regimen.  Therapeutic Interventions: 1 to 1 sessions, Unit Group sessions and Medication administration.  Evaluation of Outcomes: Progressing   RN Treatment Plan for Primary Diagnosis: Major depressive disorder, recurrent severe without psychotic features (HCC) Long Term Goal(s): Knowledge of disease and therapeutic regimen to maintain health will improve  Short Term Goals: Ability to remain free from injury will improve, Ability to verbalize frustration and anger appropriately will improve and Compliance with prescribed medications will improve  Medication Management: RN will administer medications as ordered by provider, will assess and  evaluate patient's response and provide education to patient for prescribed medication. RN will report any adverse and/or side effects to prescribing provider.  Therapeutic Interventions: 1 on 1 counseling sessions, Psychoeducation, Medication administration, Evaluate responses to treatment, Monitor vital signs and CBGs as ordered, Perform/monitor CIWA, COWS, AIMS and Fall Risk screenings as ordered, Perform wound care treatments as ordered.  Evaluation of Outcomes: Progressing   LCSW Treatment Plan for Primary Diagnosis: Major depressive disorder, recurrent severe without psychotic features (HCC) Long Term Goal(s): Safe transition to appropriate next level of care at discharge, Engage patient in therapeutic group addressing interpersonal concerns.  Short Term Goals: Engage patient in aftercare planning with referrals and resources, Increase social support and Identify triggers associated with mental health/substance abuse issues  Therapeutic Interventions: Assess for all discharge needs, 1 to 1 time with Social worker, Explore available resources and support systems, Assess for adequacy in community support network, Educate family and significant other(s) on suicide prevention, Complete Psychosocial Assessment, Interpersonal group therapy.  Evaluation of Outcomes: Progressing   Progress in Treatment: Attending groups: Yes. Participating in groups: Yes. Taking medication as prescribed: Yes. Toleration medication: Yes. Family/Significant other contact made: No, will contact:  CSW assessing proper contacts. Patient understands diagnosis: Yes. Discussing patient identified problems/goals with staff: Yes. Medical problems stabilized or resolved: Yes. Denies suicidal/homicidal ideation: Yes. Issues/concerns per patient self-inventory: No.  New problem(s) identified: No, Describe:  None identified.  New Short Term/Long Term Goal(s):  Discharge Plan or Barriers: CSW assessing proper  aftercare plans.  Reason for Continuation of Hospitalization: Depression Suicidal ideation Other; describe Substance abuse  Estimated Length of Stay: 3 days   Attendees: Patient: [name]  12/18/2016 10:31 AM  Physician: Dr. Kristine Linea, MD  12/18/2016 10:31 AM  Nursing: Hulan AmatoGwen Farrish, RN  12/18/2016 10:31 AM  RN Care Manager: 12/18/2016 10:31 AM  Social Worker: Hampton AbbotKadijah Zyren Sevigny, MSW, LCSW-A 12/18/2016 10:31 AM  Recreational Therapist:  12/18/2016 10:31 AM  Other: Enos FlingAshley Jones, MSW, LCSW 12/18/2016 10:31 AM  Other:  12/18/2016 10:31 AM  Other: 12/18/2016 10:31 AM    Scribe for Treatment Team: Lynden OxfordKadijah R Jahnavi Muratore, LCSWA 12/18/2016 10:31 AM
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
## 2016-12-18 NOTE — [facility] Group Notes (Signed)
[facility] Group Notes:  (Nursing/MHT/Case Management/Adjunct)  Date:  12/18/2016  Time:  12:18 AM  Type of Therapy:  Group Therapy  Participation Level:  Active  Participation Quality:  Appropriate  Affect:  Appropriate  Cognitive:  Appropriate  Insight:  Good  Engagement in Group:  Engaged  Modes of Intervention:  Support  Summary of Progress/Problems:  [name] [place] 12/18/2016, 12:18 AM
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
## 2016-12-18 NOTE — [facility] Group Notes (Signed)
[facility] LCSW Group Therapy   12/18/2016  9:30 am   Type of Therapy: Group Therapy   Participation Level: Patient invited but did not attend.   Hampton AbbotKadijah Aubert Choyce, MSW, LCSWA 12/18/2016, 2:10PM

## 2016-12-18 NOTE — Progress Notes (Signed)
Patient isolative to self and room. Pt reports medication making her sleepy. Did not want her meds this am but took suboxone. Pleasant and cooperative. Denies SI, HI, AVH. Encouragement and support offered. Safety checks maintained. Medications given as prescribed. Pt receptive, remains safe on unit with q 15 min cheks.

## 2016-12-18 NOTE — Plan of Care (Signed)
Problem: Safety: Goal: Ability to remain free from injury will improve Outcome: Not Progressing No injury reported or observed   

## 2016-12-19 LAB — LIPID PANEL
CHOLESTEROL: 122 mg/dL (ref 0–200)
HDL: 27 mg/dL — AB (ref >40)
LDL CALC: 60 mg/dL (ref 0–99)
TRIGLYCERIDES: 176 mg/dL — AB (ref <150)
Total CHOL/HDL Ratio: 4.5 RATIO
VLDL: 35 mg/dL (ref 0–40)

## 2016-12-19 LAB — TSH: TSH: 1.097 u[IU]/mL (ref 0.350–4.500)

## 2016-12-19 MED ORDER — QUETIAPINE FUMARATE 25 MG PO TABS
25.0000 mg | ORAL_TABLET | Freq: Three times a day (TID) | ORAL | Status: DC
  Administered 2016-12-19 – 2016-12-20 (×3): 25 mg via ORAL
  Filled 2016-12-19 (×3): qty 1

## 2016-12-19 MED ORDER — BUPRENORPHINE HCL-NALOXONE HCL 2-0.5 MG SL SUBL
2.0000 | SUBLINGUAL_TABLET | Freq: Every day | SUBLINGUAL | Status: AC
  Administered 2016-12-19: 2 via SUBLINGUAL
  Filled 2016-12-19: qty 2

## 2016-12-19 NOTE — Plan of Care (Signed)
Problem: Activity: Goal: Sleeping patterns will improve Outcome: Progressing Patient slept for Estimated Hours of 6.45; every 15 minutes safety round maintained, no injury or falls during this shift.    

## 2016-12-19 NOTE — Progress Notes (Signed)
Pt. Alert and oriented, skin warm and dry, in no acute distress. Pt. Denies SI, HI, and AVH. Pt. Encouraged to let nursing staff know of any concerns or needs.  Pt pleasant in interaction with staff but has remained in her room in bed most of shift other than to use the phone. Encouragement and support offered. Encouraged patient to interact with staff and peers. Medications given as prescribed. Safety checks maintained. Pt receptive and remains safe on unit with q 15 min checks. 

## 2016-12-19 NOTE — [facility] Counselor (Signed)
CSW attempted to meet with patient to complete psychosocial assessment. Patient was lying in her bed. Patient informed CSW that she was still not feeling well and did not want to get out of the bed right now. Patient asked CSW to come back later. Patient is unwilling to engage in assessment at this time, CSW will attempt assessment later.   Shuntae Herzig G. Garnette CzechSampson MSW, Sagewest LanderCSWA 12/19/2016 11:27 AM

## 2016-12-19 NOTE — Plan of Care (Signed)
Problem: Medication: Goal: Compliance with prescribed medication regimen will improve Outcome: Progressing Patient took all prescribed medications this morning.

## 2016-12-19 NOTE — [facility] Group Notes (Signed)
[facility] LCSW Group Therapy  12/19/2016 2:34 PM  Type of Therapy:  Group Therapy  Participation Level:  Patient did not attend group. CSW invited patient to group.   Summary of Progress/Problems: Balance in life: Patients will discuss the concept of balance and how it looks and feels to be unbalanced. Pt will identify areas in their life that is unbalanced and ways to become more balanced. They discussed what aspects in their lives has influenced their self care. Patients also discussed self care in the areas of self regulation/control, hygiene/appearance, sleep/relaxation, healthy leisure, healthy eating habits, exercise, inner peace/spirituality, self improvement, sobriety, and health management. They were challenged to identify changes that are needed in order to improve self care.  Teddy Pena G. Garnette CzechSampson MSW, LCSWA 12/19/2016, 2:34 PM

## 2016-12-19 NOTE — Progress Notes (Signed)
Patient ID: [name], female   DOB: [birth year], 31 y.o.   MRN: [id number] Pleasant on approach, unremarkable in streets clothes, good body maintainence, fixated on Suboxone, "when is the next time I suppose to receive my Suboxone, will they prescribe it for me when I leave, and I will be able to get it ????" Participated fairly well in group and courtyard play, denied SI/HI/AVH.

## 2016-12-19 NOTE — Progress Notes (Signed)
Isolative to self and room. Denies SI, HI, AVH. Med compliant. Does not participate in group therapy. Stays in bed all day other than meals and meds. No interaction with peers. Minimal interaction with staff. Encouragement and support offered. Encouraged patient with staff and peers. Medications given as prescribed. Safety checks maintained. Pt receptive and remains safe on unit with q 15 min checks.
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
## 2016-12-19 NOTE — [facility] Group Notes (Signed)
[facility] Group Notes:  (Nursing/MHT/Case Management/Adjunct)  Date:  12/19/2016  Time:  3:50 AM  Type of Therapy:  Psychoeducational Skills  Participation Level:  Did Not Attend  Foy GuadalajaraJasmine R Miachel Nardelli 12/19/2016, 3:50 AM

## 2016-12-19 NOTE — [facility] Group Notes (Signed)
[facility] LCSW Group Therapy Note  Type of Therapy and Topic:  Group Therapy:  Goals Group: SMART Goals  Participation Level:  Patient did not attend group. CSW invited patient to group.   Description of Group:   The purpose of a daily goals group is to assist and guide patients in setting recovery/wellness-related goals.  The objective is to set goals as they relate to the crisis in which they were admitted. Patients will be using SMART goal modalities to set measurable goals.  Characteristics of realistic goals will be discussed and patients will be assisted in setting and processing how one will reach their goal. Facilitator will also assist patients in applying interventions and coping skills learned in psycho-education groups to the SMART goal and process how one will achieve defined goal.  Therapeutic Goals: -Patients will develop and document one goal related to or their crisis in which brought them into treatment. -Patients will be guided by LCSW using SMART goal setting modality in how to set a measurable, attainable, realistic and time sensitive goal.  -Patients will process barriers in reaching goal. -Patients will process interventions in how to overcome and successful in reaching goal.   Summary of Patient Progress:  Patient Goal: Patient did not attend group. CSW invited patient to group.    Therapeutic Modalities:   Motivational Interviewing Engineer, manufacturing systemsCognitive Behavioral Therapy Crisis Intervention Model SMART goals setting  Massie Cogliano G. Garnette CzechSampson MSW, LCSWA 12/19/2016 10:20 AM
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
## 2016-12-19 NOTE — Plan of Care (Signed)
Problem: Safety: Goal: Ability to remain free from injury will improve Outcome: Progressing Pt has not displayed any self harm injury on the unit. Pt remains safe on the unit.

## 2016-12-20 LAB — HEMOGLOBIN A1C
HEMOGLOBIN A1C: 5.4 % (ref 4.8–5.6)
MEAN PLASMA GLUCOSE: 108 mg/dL

## 2016-12-20 MED ORDER — QUETIAPINE FUMARATE 25 MG PO TABS
50.0000 mg | ORAL_TABLET | Freq: Three times a day (TID) | ORAL | Status: DC
  Administered 2016-12-20 – 2016-12-21 (×4): 50 mg via ORAL
  Filled 2016-12-20 (×4): qty 2

## 2016-12-20 MED ORDER — FLUTICASONE PROPIONATE 50 MCG/ACT NA SUSP
2.0000 | Freq: Every day | NASAL | Status: DC
  Administered 2016-12-20: 2 via NASAL
  Filled 2016-12-20: qty 16

## 2016-12-20 MED ORDER — QUETIAPINE FUMARATE 200 MG PO TABS: 200.0000 mg | ORAL_TABLET | Freq: Every day | ORAL | 1 refills | Status: DC

## 2016-12-20 MED ORDER — NALOXONE HCL 4 MG/0.1ML NA LIQD: NASAL | 1 refills | Status: DC

## 2016-12-20 MED ORDER — QUETIAPINE FUMARATE 200 MG PO TABS
200.0000 mg | ORAL_TABLET | Freq: Every day | ORAL | Status: DC
  Administered 2016-12-20 – 2016-12-21 (×2): 200 mg via ORAL
  Filled 2016-12-20 (×2): qty 1

## 2016-12-20 MED ORDER — QUETIAPINE FUMARATE 50 MG PO TABS: 50.0000 mg | ORAL_TABLET | Freq: Three times a day (TID) | ORAL | 1 refills | Status: DC

## 2016-12-20 NOTE — Plan of Care (Signed)
Problem: Activity: Goal: Risk for activity intolerance will decrease Outcome: Progressing Patient has been observed attending groups, walking around the unity and using proper hygiene techniques to feel better.
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
## 2016-12-20 NOTE — Progress Notes (Signed)
Patient was visible on the unit walking the hallways and sitting in dayroom interacting with select peers. Patient attended unit group. Patient was very pleasant and is anticipating her discharge. Patient is alert and oriented x 4, breathing unlabored, and extremities x 4 within normal limits. Patient is calm and cooperative. Patient did not display any disruptive behavior. Patient denies SI/HI/AH/VH. Patient asked for fresh linen and towels for a shower. Will continue to monitor patient and notify MD of any changes.

## 2016-12-20 NOTE — [facility] Suicide Risk Assessment (Signed)
[facility] INPATIENT:  Family/Significant Other Suicide Prevention Education  Suicide Prevention Education:  Education Completed; mother,[name] ph#: [phone] has been identified by the patient as the family member/significant other with whom the patient will be residing, and identified as the person(s) who will aid the patient in the event of a mental health crisis (suicidal ideations/suicide attempt).  With written consent from the patient, the family member/significant other has been provided the following suicide prevention education, prior to the and/or following the discharge of the patient.  The suicide prevention education provided includes the following:  Suicide risk factors  Suicide prevention and interventions  National Suicide Hotline telephone number  [facility] assessment telephone number  [facility]  [facility] and/or [facility]idential Mobile Crisis [facility] telephone number  Request made of family/significant other to:  Remove weapons (e.g., guns, rifles, knives), all items previously/currently identified as safety concern.    Remove drugs/medications (over-the-counter, prescriptions, illicit drugs), all items previously/currently identified as a safety concern.  The family member/significant other verbalizes understanding of the suicide prevention education information provided.  The family member/significant other agrees to remove the items of safety concern listed above.  Tara OxfordKadijah R Jasani [name], MSW, LCSW-A 12/20/2016, 2:21 PM
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
## 2016-12-20 NOTE — [facility] Group Notes (Signed)
[facility] LCSW Group Therapy  12/20/2016 11:48 AM  Type of Therapy:  Group Therapy  Participation Level:  Active  Participation Quality:  Attentive and Sharing  Affect:  Appropriate  Cognitive:  Alert  Insight:  Developing/Improving  Engagement in Therapy:  Developing/Improving  Modes of Intervention:  Activity, Discussion, Education, Problem-solving, Reality Testing, Socialization and Support  Summary of Progress/Problems: Stress management: Patients defined and discussed the topic of stress and the related symptoms and triggers for stress. Patients identified healthy coping skills they would like to try during hospitalization and after discharge to manage stress in a healthy way. CSW offered insight to varying stress management techniques.   Azuri Bozard G. Garnette CzechSampson MSW, LCSWA 12/20/2016, 11:51 AM

## 2016-12-20 NOTE — [facility] Group Notes (Signed)
[facility] Group Notes:  (Nursing/MHT/Case Management/Adjunct)  Date:  12/20/2016  Time:  11:35 PM  Type of Therapy:  Group Therapy  Participation Level:  Active  Participation Quality:  Appropriate  Affect:  Appropriate  Cognitive:  Appropriate  Insight:  Appropriate  Engagement in Group:  Engaged  Modes of Intervention:  Discussion  Summary of Progress/Problems:  Tara EkJanice Marie Irelyn [name] 12/20/2016, 11:35 PM
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
## 2016-12-20 NOTE — Progress Notes (Signed)
Recreation Therapy Notes  Date: 06.01.18 Time: 1:00 pm Location: Craft Room  Group Topic: Social Skills  Goal Area(s) Addresses:  Patient will effectively work with peers towards shared goal. Patient will identify skill used to make activity successful. Patient will identify how skills used during activity can be used to reach post d/c. goals.  Behavioral Response: Attentive, Interactive  Intervention: Life Boat  Activity: Patients were given a scenario that we were in [place] and we decided to get on a boat and explore. Patients were given a list of 16 people Marden Noble(Jimmy Fallon, nurse, bus driver, etc.) who were on the boat exploring with us. While we were exploring, the boat sprung a leak and the patients were put in charge of where to put people. There are two life boats. One is bigger and faster that holds everyone in the room plus 8 people on the list and there is a raft that holds 8 people.  Education: LRT educated patients on healthy support systems.  Education Outcome: Acknowledges education/In group clarification offered  Clinical Observations/Feedback: Patient worked with peer towards shared goal. Patient used effective communication, problem solving, and teamwork. Patient contributed to group discussion by stating what skills she used in group, and why these skills are important.  Jacquelynn CreeGreene,Katalena Malveaux M, LRT/CTRS 12/20/2016 2:00 PM

## 2016-12-21 MED ORDER — TRAZODONE HCL 100 MG PO TABS
100.0000 mg | ORAL_TABLET | Freq: Every day | ORAL | Status: DC
  Administered 2016-12-21: 100 mg via ORAL
  Filled 2016-12-21: qty 1

## 2016-12-21 MED ORDER — GABAPENTIN 300 MG PO CAPS
300.0000 mg | ORAL_CAPSULE | Freq: Every day | ORAL | Status: DC
  Administered 2016-12-21: 300 mg via ORAL
  Filled 2016-12-21: qty 1

## 2016-12-21 NOTE — Progress Notes (Signed)
Pt. Alert and oriented, skin warm and dry, in no acute distress. Pt. Denies SI, HI, and AVH. Pt. Encouraged to let nursing staff know of any concerns or needs.  Pt pleasant in interaction with staff but has remained in her room in bed most of shift other than to use the phone. Encouragement and support offered. Encouraged patient to interact with staff and peers. Medications given as prescribed. Safety checks maintained. Pt receptive and remains safe on unit with q 15 min checks.

## 2016-12-21 NOTE — [facility] Group Notes (Signed)
[facility] LCSW Group Therapy  12/21/2016 2:06 PM  Type of Therapy:  Group Therapy  Participation Level:  Patient did not attend group. CSW invited patient to group.   Summary of Progress/Problems: Boundaries: Patients defined boundaries and discussed the importance having clear boundaries within their relationships. Patients identified their own boundaries. Patients established limits and rules within relationships both personally and professionally. Patients discussed ways to create and/ or improve their personal boundaries and utilizing assertive communications skills.   [name] G. Garnette CzechSampson MSW, LCSWA 12/21/2016, 2:06 PM

## 2016-12-21 NOTE — Progress Notes (Signed)
Pt irritable this morning complained she did not sleep good last night. Pt has anxious mood. Pt denies SI, HI, a/v hallucinations. Pt is medication compliant. Commits to safety on unit. Will continue to monitor for safety.

## 2016-12-21 NOTE — Plan of Care (Signed)
Problem: Safety: Goal: Ability to remain free from injury will improve Outcome: Progressing Pt has not displayed any self harm injurious behavior on the unit.

## 2016-12-21 NOTE — Plan of Care (Signed)
Problem: Safety: Goal: Ability to remain free from injury will improve Outcome: Progressing Pt is free from injury during hospital stay. Pt commits to safety.

## 2016-12-21 NOTE — [facility] Group Notes (Signed)
[facility] Group Notes:  (Nursing/MHT/Case Management/Adjunct)  Date:  12/21/2016  Time:  11:20 PM  Type of Therapy:  Psychoeducational Skills  Participation Level:  Active  Participation Quality:  Appropriate and Sharing  Affect:  Appropriate  Cognitive:  Appropriate  Insight:  Appropriate and Good  Engagement in Group:  Engaged  Modes of Intervention:  Discussion, Socialization and Support  Summary of Progress/Problems:  [name] 12/21/2016, 11:20 PM
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
## 2016-12-22 MED ORDER — GABAPENTIN 300 MG PO CAPS: 300.0000 mg | ORAL_CAPSULE | Freq: Every day | ORAL | 0 refills | Status: DC

## 2016-12-22 MED ORDER — TRAZODONE HCL 100 MG PO TABS
100.0000 mg | ORAL_TABLET | Freq: Every evening | ORAL | 0 refills | Status: DC | PRN
Start: 2016-12-22 — End: 2019-10-06

## 2016-12-22 NOTE — Plan of Care (Signed)
Problem: Health Behavior/Discharge Planning: Goal: Ability to manage health-related needs will improve Outcome: Progressing Compliant with treatment. Expressing readiness for discharge

## 2016-12-22 NOTE — Plan of Care (Signed)
Problem: Activity: Goal: Will identify at least one activity in which they can participate Outcome: Progressing Expressed motivation for outpatient programs after discharge

## 2016-12-22 NOTE — Plan of Care (Signed)
Problem: Safety: Goal: Ability to remain free from injury will improve Outcome: Progressing No falls during this shift. Safety precautions maintained

## 2016-12-22 NOTE — Progress Notes (Signed)
Pt denies SI, HI, a/v hallucinations. Pt commits to safety for discharge. Follow up appointments, discharge medications, and treatment reviewed.Pt is in no distress no signs of withdrawal evident.  Pt verbalized understanding of discharge instructions. All belongings returned to pt upon discharge. Pt will be picked up safely by mother.

## 2016-12-22 NOTE — Progress Notes (Signed)
  [facility] :  Will you be returning to the same living situation after discharge:  Yes,  home with mother At discharge, do you have transportation home?: Yes,  mother Do you have the ability to pay for your medications: Yes,  patient has insurance  Release of information consent forms completed and in the chart;  Patient's signature needed at discharge.  Patient to Follow up at: Follow-up Information    Bowen, Tara GildingMargaret-Mary P, MD. Tara CapesGo on 12/23/2016.   Specialty:  Emergency Medicine Why:  Please arrive to your appointment for your initial assessment for Suboxone clinic Monday, June 4th at [facility]8AM. It is important to arrive on time and bring photo ID. Also, bring your discharge paperwork to appointment. If you have questions, contact clinic. Contact information: [address] [place] [address] [phone]        Services, [facility]. Go in 7 day(s).   Why:  Follow-up within 7 days of discharge for continued outpatient/crisis services including medication management & therapy. Bring insurance card, photo I.D., current meds, & discharge summary with you. Walk-in hours for new patients are M-F 8a-4p. Contact information: [address] [place] [address] [phone]           Next level of care provider has access to [facility] Link:no  Safety Planning and Suicide Prevention discussed: Yes,  with patient and mother  Have you used any form of tobacco in the last 30 days? (Cigarettes, Smokeless Tobacco, Cigars, and/or Pipes): Yes  Has patient been referred to the Quitline?: Patient refused referral  Patient has been referred for addiction treatment: Yes  [name] MSW, LCSWA 12/22/2016, 10:46 AM

## 2016-12-22 NOTE — Progress Notes (Signed)
Patient stayed in the milieu, pleasant and cooperative. Focused on possible discharge in AM. Denying thoughts of self harm. Denying hallucination. Pt did not have  any concern this shift.

## 2017-10-27 ENCOUNTER — Encounter: Payer: Self-pay | Admitting: Family Medicine

## 2017-10-28 ENCOUNTER — Encounter: Payer: Self-pay | Admitting: Family Medicine

## 2017-11-04 ENCOUNTER — Encounter: Payer: Self-pay | Admitting: Family Medicine

## 2017-11-25 ENCOUNTER — Encounter: Payer: Self-pay | Admitting: Family Medicine

## 2017-12-01 ENCOUNTER — Encounter: Payer: Self-pay | Admitting: Family Medicine

## 2017-12-04 ENCOUNTER — Encounter: Payer: Self-pay | Admitting: Family Medicine

## 2019-04-09 ENCOUNTER — Ambulatory Visit: Payer: Self-pay | Admitting: "Endocrinology

## 2019-04-27 ENCOUNTER — Encounter: Payer: Self-pay | Admitting: "Endocrinology

## 2019-04-27 ENCOUNTER — Telehealth: Payer: Self-pay | Admitting: "Endocrinology

## 2019-04-27 ENCOUNTER — Other Ambulatory Visit: Payer: Self-pay

## 2019-04-27 ENCOUNTER — Ambulatory Visit (INDEPENDENT_AMBULATORY_CARE_PROVIDER_SITE_OTHER): Payer: Medicaid Other | Admitting: "Endocrinology

## 2019-04-27 VITALS — BP 109/77 | HR 101 | Ht 67.0 in | Wt 178.0 lb

## 2019-04-27 DIAGNOSIS — E049 Nontoxic goiter, unspecified: Secondary | ICD-10-CM | POA: Insufficient documentation
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
## 2019-04-28 LAB — TSH: TSH: 0.34 m[IU]/L — ABNORMAL LOW

## 2019-04-28 LAB — THYROID PEROXIDASE ANTIBODY: Thyroperoxidase Ab SerPl-aCnc: 3 [IU]/mL

## 2019-04-28 LAB — THYROGLOBULIN ANTIBODY: Thyroglobulin Ab: <1 IU/mL (ref <=1)

## 2019-04-28 LAB — T3, FREE: T3, Free: 3 pg/mL (ref 2.3–4.2)

## 2019-04-28 LAB — T4, FREE: Free T4: 1.2 ng/dL (ref 0.8–1.8)

## 2019-08-27 ENCOUNTER — Telehealth: Payer: Self-pay | Admitting: "Endocrinology

## 2019-08-27 NOTE — Telephone Encounter (Signed)
Move it. TNX

## 2019-08-27 NOTE — Telephone Encounter (Signed)
Pt is on schedule for Monday, her ultrasound is scheduled for 3/1. Do you want to keep appt or move it?

## 2019-08-30 ENCOUNTER — Ambulatory Visit: Payer: Medicaid Other | Admitting: "Endocrinology

## 2019-09-20 ENCOUNTER — Ambulatory Visit (HOSPITAL_COMMUNITY): Payer: Medicaid Other

## 2019-09-24 ENCOUNTER — Ambulatory Visit: Payer: Medicaid Other | Admitting: "Endocrinology

## 2019-09-29 ENCOUNTER — Other Ambulatory Visit (HOSPITAL_COMMUNITY): Payer: Medicaid Other

## 2019-10-01 ENCOUNTER — Ambulatory Visit (HOSPITAL_COMMUNITY)
Admission: RE | Admit: 2019-10-01 | Discharge: 2019-10-01 | Disposition: A | Discharge status: Home / Self Care | Payer: Medicaid Other | Source: Ambulatory Visit | Attending: "Endocrinology | Admitting: "Endocrinology

## 2019-10-01 ENCOUNTER — Other Ambulatory Visit: Payer: Self-pay

## 2019-10-01 DIAGNOSIS — E049 Nontoxic goiter, unspecified: Secondary | ICD-10-CM | POA: Insufficient documentation

## 2019-10-06 ENCOUNTER — Ambulatory Visit (INDEPENDENT_AMBULATORY_CARE_PROVIDER_SITE_OTHER): Payer: Medicaid Other | Admitting: "Endocrinology

## 2019-10-06 ENCOUNTER — Encounter: Payer: Self-pay | Admitting: "Endocrinology

## 2019-10-06 VITALS — BP 91/63 | HR 79 | Ht 67.0 in | Wt 213.8 lb

## 2019-10-06 DIAGNOSIS — E049 Nontoxic goiter, unspecified: Secondary | ICD-10-CM
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
## 2019-10-06 NOTE — Patient Instructions (Signed)

## 2020-02-16 ENCOUNTER — Emergency Department (HOSPITAL_COMMUNITY): Payer: Medicaid Other

## 2020-02-16 ENCOUNTER — Other Ambulatory Visit: Payer: Self-pay

## 2020-02-16 ENCOUNTER — Emergency Department (HOSPITAL_COMMUNITY)
Admission: EM | Admit: 2020-02-16 | Discharge: 2020-02-16 | Disposition: A | Discharge status: Home / Self Care | Payer: Medicaid Other | Attending: Emergency Medicine | Admitting: Emergency Medicine

## 2020-02-16 ENCOUNTER — Encounter (HOSPITAL_COMMUNITY): Payer: Self-pay | Admitting: *Deleted

## 2020-02-16 DIAGNOSIS — W1842XA Slipping, tripping and stumbling without falling due to stepping into hole or opening, initial encounter: Secondary | ICD-10-CM | POA: Insufficient documentation

## 2020-02-16 DIAGNOSIS — S0990XA Unspecified injury of head, initial encounter: Secondary | ICD-10-CM | POA: Diagnosis present

## 2020-02-16 DIAGNOSIS — F17210 Nicotine dependence, cigarettes, uncomplicated: Secondary | ICD-10-CM | POA: Diagnosis not present

## 2020-02-16 DIAGNOSIS — F1410 Cocaine abuse, uncomplicated: Secondary | ICD-10-CM | POA: Insufficient documentation

## 2020-02-16 DIAGNOSIS — F1210 Cannabis abuse, uncomplicated: Secondary | ICD-10-CM | POA: Diagnosis not present

## 2020-02-16 DIAGNOSIS — Y92096 Garden or yard of other non-institutional residence as the place of occurrence of the external cause: Secondary | ICD-10-CM | POA: Diagnosis not present

## 2020-02-16 DIAGNOSIS — Y939 Activity, unspecified: Secondary | ICD-10-CM | POA: Diagnosis not present

## 2020-02-16 DIAGNOSIS — R519 Headache, unspecified: Secondary | ICD-10-CM

## 2020-02-16 DIAGNOSIS — F1120 Opioid dependence, uncomplicated: Secondary | ICD-10-CM | POA: Diagnosis not present

## 2020-02-16 DIAGNOSIS — G8929 Other chronic pain: Secondary | ICD-10-CM | POA: Diagnosis not present

## 2020-02-16 DIAGNOSIS — Y999 Unspecified external cause status: Secondary | ICD-10-CM | POA: Insufficient documentation

## 2020-02-16 DIAGNOSIS — F1910 Other psychoactive substance abuse, uncomplicated: Secondary | ICD-10-CM | POA: Diagnosis not present

## 2020-02-16 LAB — POC URINE PREG, ED: Preg Test, Ur: NEGATIVE

## 2020-02-16 NOTE — Discharge Instructions (Signed)
At this time there does not appear to be the presence of an emergent medical condition, however there is always the potential for conditions to change. Please read and follow the below instructions.  Please return to the Emergency Department immediately for any new or worsening symptoms. Please be sure to follow up with your Primary Care Provider within one week regarding your visit today; please call their office to schedule an appointment even if you are feeling better for a follow-up visit. Call [facility] to establish a primary care provider if you do not already have one.  Get help right away if: You have: A very bad headache that is not helped by medicine. Trouble walking or weakness in your arms and legs. Clear or bloody fluid coming from your nose or ears. Changes in how you see (vision). Shaking movements that you cannot control. You lose your balance. You vomit. The black centers of your eyes (pupils) change in size. Your speech is slurred. Your dizziness gets worse. You pass out. You are sleepier than normal and have trouble staying awake. You have any new/concerning or worsening of symptoms These symptoms may be an emergency. Do not wait to see if the symptoms will go away. Get medical help right away. Call your local emergency services (911 in the [place] Do not drive yourself to the hospital.  Please read the additional information packets attached to your discharge summary.  Do not take your medicine if  develop an itchy rash, swelling in your mouth or lips, or difficulty breathing; call 911 and seek immediate emergency medical attention if this occurs.  You may review your lab tests and imaging results in their entirety on your MyChart account.  Please discuss all results of fully with your primary care provider and other specialist at your follow-up visit.  Note: Portions of this text may have been transcribed using voice recognition  software. Every effort was made to ensure accuracy; however, inadvertent computerized transcription errors may still be present.

## 2020-02-16 NOTE — ED Notes (Signed)
Patient transported to CT 

## 2020-02-16 NOTE — ED Triage Notes (Signed)
Pt states about two weeks ago she stepped in a hole in the yard, fell, striking her left face on a tree stump. She was not evaluated at that time. Reports she still has swelling to the left facial area and it is still tender.
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
## 2020-04-07 ENCOUNTER — Ambulatory Visit: Payer: Medicaid Other | Admitting: "Endocrinology

## 2020-11-29 NOTE — Telephone Encounter (Signed)
error 

## 2024-08-16 ENCOUNTER — Emergency Department (HOSPITAL_COMMUNITY)
Admission: EM | Admit: 2024-08-16 | Discharge: 2024-08-16 | Disposition: A | Discharge status: Home / Self Care | Payer: Self-pay | Attending: Emergency Medicine | Admitting: Emergency Medicine

## 2024-08-16 ENCOUNTER — Encounter (HOSPITAL_COMMUNITY): Payer: Self-pay

## 2024-08-16 ENCOUNTER — Other Ambulatory Visit: Payer: Self-pay

## 2024-08-16 DIAGNOSIS — S91312A Laceration without foreign body, left foot, initial encounter: Secondary | ICD-10-CM | POA: Insufficient documentation

## 2024-08-16 DIAGNOSIS — W25XXXA Contact with sharp glass, initial encounter: Secondary | ICD-10-CM | POA: Insufficient documentation

## 2024-08-16 MED ORDER — LIDOCAINE HCL (PF) 1 % IJ SOLN
10.0000 mL | Freq: Once | INTRAMUSCULAR | Status: AC
  Administered 2024-08-16: 10 mL
  Filled 2024-08-16: qty 10

## 2024-08-16 NOTE — ED Triage Notes (Signed)
 Pt has laceration to the top of her left foot from broken glass.

## 2024-08-16 NOTE — ED Notes (Signed)
 L foot cleaned with saline.

## 2024-08-16 NOTE — ED Provider Notes (Signed)
 " [place] [facility]GENC[facility] DE[facility]MENT AT [facility] Provider Note   CSN: [id number] Arrival date & time: 08/16/24  1713     Patient presents with: Extremity Laceration   [name] is a 39 y.o. female patient who presents to the emergency department today for further evaluation of a laceration to the left dorsal aspect of the foot.  Patient was helping her daughter clean her room when she excellently sliced her foot on a piece of open glass.  She does not feel that any glass is in there.  Tetanus is up-to-date.  She denies any other injury.   HPI     Prior to Admission medications  Medication Sig Start Date End Date Taking? Authorizing Provider  Cholecalciferol (VITAMIN D) 125 MCG (5000 UT) CAPS Take 1 tablet by mouth daily.    [provider]    Allergies: Patient has no known allergies.    Review of Systems  All other systems reviewed and are negative.   Updated Vital Signs BP 108/79   Pulse 71   Temp 98 F (36.7 C)   Resp 18   Wt 86.2 kg   LMP 07/24/2024   SpO2 96%   BMI 29.76 kg/m   Physical Exam Vitals and nursing note reviewed.  Constitutional:      Appearance: Normal appearance.  HENT:     Head: Normocephalic and atraumatic.  Eyes:     General:        Right eye: No discharge.        Left eye: No discharge.     Conjunctiva/sclera: Conjunctivae normal.  Pulmonary:     Effort: Pulmonary effort is normal.  Musculoskeletal:     Comments: Straightening compartments of the left lower foot are soft.  Patient has full range of motion in the toes and good strength with resistance.  Skin:    General: Skin is warm and dry.     Findings: No rash.     Comments: 4.5 inch deep abrasion to the left dorsal aspect of the foot.  There is a second 4.5 inch superficial laceration to the dorsal aspect of the left foot as well.  Neurological:     General: No focal deficit present.     Mental Status: She is alert.  Psychiatric:        Mood and Affect:  Mood normal.        Behavior: Behavior normal.     (all labs ordered are listed, but only abnormal results are displayed) Labs Reviewed - No data to display  EKG: None  Radiology: No results found.   .Laceration Repair  Date/Time: 08/16/2024 7:03 PM  Performed by: Theotis Cameron HERO, PA-C Authorized by: Theotis Cameron HERO, PA-C   Consent:    Consent obtained:  Verbal   Consent given by:  Patient   Risks discussed:  Infection and need for additional repair Universal protocol:    Procedure explained and questions answered to patient or proxy's satisfaction: yes     Relevant documents present and verified: yes     Test results available: yes     Imaging studies available: no     Required blood products, implants, devices, and special equipment available: yes     Site/side marked: yes     Immediately prior to procedure, a time out was called: no     Patient identity confirmed:  Verbally with patient and arm band Anesthesia:    Anesthesia method:  Local infiltration   Local anesthetic:  Lidocaine  1% w/o epi Laceration details:    Location:  Foot   Foot location:  Top of L foot   Length (cm):  12.7 Pre-procedure details:    Preparation:  Patient was prepped and draped in usual sterile fashion Exploration:    Hemostasis achieved with:  Direct pressure   Imaging outcome: foreign body not noted     Wound exploration: wound explored through full range of motion     Wound extent: areolar tissue violated     Wound extent: fascia not violated, no foreign body, no signs of injury, no nerve damage, no tendon damage, no underlying fracture and no vascular damage     Contaminated: no   Treatment:    Area cleansed with:  Saline   Amount of cleaning:  Extensive   Irrigation solution:  Sterile saline   Irrigation method:  Pressure wash   Visualized foreign bodies/material removed: no     Debridement:  None   Undermining:  None   Scar revision: no   Skin repair:    Repair method:   Sutures   Suture size:  4-0   Suture material:  Prolene   Suture technique:  Simple interrupted   Number of sutures:  6 Approximation:    Approximation:  Close Repair type:    Repair type:  Intermediate Post-procedure details:    Dressing:  Open (no dressing)   Procedure completion:  Tolerated well, no immediate complications    Medications Ordered in the ED  lidocaine  (PF) (XYLOCAINE ) 1 % injection 10 mL (has no administration in time range)     Medical Decision Making [name] is a 38 y.o. female patient who presents to the emergency department today for further evaluation of a laceration to the dorsal aspect of the left foot.  I thoroughly irrigated the wound and there was no evidence of glass.  Patient opted not to get an x-ray.  Tetanus is up-to-date.  I repaired the laceration with simple interrupted sutures.  Please see procedure note for further detail.  He will return to the emergency department in 7-10 days for suture removal.  Strict turn precautions were discussed.  Will treat with over-the-counter anti-inflammatories.  She is safe for discharge.   Risk Prescription drug management.   Final diagnoses:  Laceration of left foot, initial encounter    ED Discharge Orders     None          Theotis Cameron HERO, [place] 08/16/24 1907  "

## 2024-08-16 NOTE — Discharge Instructions (Signed)
 Please return to the emergency department in 7-10 days for suture removal.  You can take 600 mg of ibuprofen  as needed for pain every 6 hours.  Please return to the emerged from a sooner for any worsening pain, bleeding that would all stop, foul-smelling drainage, or other concerns you might have.
